# Patient Record
Sex: Female | Born: 1970
Health system: Southern US, Community
[De-identification: ages and names within clinical notes are randomized; demographics above are authoritative.]

## PROBLEM LIST (undated history)

## (undated) DIAGNOSIS — T7840XA Allergy, unspecified, initial encounter: Secondary | ICD-10-CM

## (undated) DIAGNOSIS — F53 Postpartum depression: Secondary | ICD-10-CM

## (undated) DIAGNOSIS — K219 Gastro-esophageal reflux disease without esophagitis: Secondary | ICD-10-CM

## (undated) DIAGNOSIS — O99345 Other mental disorders complicating the puerperium: Secondary | ICD-10-CM

## (undated) DIAGNOSIS — R002 Palpitations: Secondary | ICD-10-CM

## (undated) HISTORY — DX: Postpartum depression: F53.0

## (undated) HISTORY — DX: Palpitations: R00.2

## (undated) HISTORY — DX: Other mental disorders complicating the puerperium: O99.345

## (undated) HISTORY — DX: Allergy, unspecified, initial encounter: T78.40XA

## (undated) HISTORY — PX: COLONOSCOPY: SHX174

---

## 2009-04-10 ENCOUNTER — Ambulatory Visit: Payer: Self-pay | Admitting: Gastroenterology

## 2009-04-10 DIAGNOSIS — K648 Other hemorrhoids: Secondary | ICD-10-CM | POA: Insufficient documentation

## 2009-04-14 ENCOUNTER — Telehealth: Payer: Self-pay | Admitting: Gastroenterology

## 2009-04-30 ENCOUNTER — Ambulatory Visit: Payer: Self-pay | Admitting: Gastroenterology

## 2009-08-23 LAB — HM PAP SMEAR: HM Pap smear: NORMAL

## 2011-04-27 ENCOUNTER — Telehealth: Payer: Self-pay | Admitting: *Deleted

## 2011-04-27 DIAGNOSIS — Z0000 Encounter for general adult medical examination without abnormal findings: Secondary | ICD-10-CM

## 2011-04-27 NOTE — Telephone Encounter (Signed)
Received staff msg pt coming in for CPX. Need labs entered in EPIC....04/27/11@12 :15pm/LMB

## 2011-05-21 ENCOUNTER — Other Ambulatory Visit (INDEPENDENT_AMBULATORY_CARE_PROVIDER_SITE_OTHER): Payer: BC Managed Care – PPO

## 2011-05-21 ENCOUNTER — Ambulatory Visit (INDEPENDENT_AMBULATORY_CARE_PROVIDER_SITE_OTHER): Payer: BC Managed Care – PPO | Admitting: Internal Medicine

## 2011-05-21 ENCOUNTER — Encounter: Payer: Self-pay | Admitting: Internal Medicine

## 2011-05-21 ENCOUNTER — Other Ambulatory Visit: Payer: Self-pay | Admitting: Internal Medicine

## 2011-05-21 ENCOUNTER — Ambulatory Visit: Payer: Self-pay | Admitting: Internal Medicine

## 2011-05-21 VITALS — BP 100/62 | HR 48 | Temp 98.6°F | Ht 65.0 in | Wt 179.4 lb

## 2011-05-21 DIAGNOSIS — Z0000 Encounter for general adult medical examination without abnormal findings: Secondary | ICD-10-CM

## 2011-05-21 LAB — URINALYSIS, ROUTINE W REFLEX MICROSCOPIC
Bilirubin Urine: NEGATIVE
Ketones, ur: NEGATIVE
Leukocytes, UA: NEGATIVE
Urine Glucose: NEGATIVE
Urobilinogen, UA: 0.2 (ref 0.0–1.0)
pH: 7 (ref 5.0–8.0)

## 2011-05-21 LAB — CBC WITH DIFFERENTIAL/PLATELET
Basophils Absolute: 0 10*3/uL (ref 0.0–0.1)
Basophils Relative: 0.5 % (ref 0.0–3.0)
Eosinophils Absolute: 0.1 10*3/uL (ref 0.0–0.7)
HCT: 38.1 % (ref 36.0–46.0)
Hemoglobin: 12.8 g/dL (ref 12.0–15.0)
Lymphs Abs: 1.9 10*3/uL (ref 0.7–4.0)
MCHC: 33.4 g/dL (ref 30.0–36.0)
MCV: 89.5 fl (ref 78.0–100.0)
Neutro Abs: 5.7 10*3/uL (ref 1.4–7.7)
RBC: 4.26 Mil/uL (ref 3.87–5.11)
RDW: 12.9 % (ref 11.5–14.6)

## 2011-05-21 LAB — HEPATIC FUNCTION PANEL
Alkaline Phosphatase: 45 U/L (ref 39–117)
Bilirubin, Direct: 0 mg/dL (ref 0.0–0.3)
Total Bilirubin: 0.5 mg/dL (ref 0.3–1.2)

## 2011-05-21 LAB — LIPID PANEL
HDL: 46.2 mg/dL (ref >39.00)
Total CHOL/HDL Ratio: 4
VLDL: 24 mg/dL (ref 0.0–40.0)

## 2011-05-21 LAB — BASIC METABOLIC PANEL
CO2: 27 mEq/L (ref 19–32)
Calcium: 9.4 mg/dL (ref 8.4–10.5)
GFR: 133.4 mL/min (ref >60.00)
Sodium: 139 mEq/L (ref 135–145)

## 2011-05-21 LAB — TSH: TSH: 0.86 u[IU]/mL (ref 0.35–5.50)

## 2011-05-21 NOTE — Progress Notes (Signed)
  Subjective:    Patient ID: Andrea Wells, female    DOB: 01-03-1971, 40 y.o.   MRN: [id number]  HPI patient is here today for annual physical. Patient feels well and has no complaints.  History reviewed. No pertinent past medical history.  Family History  Problem Relation Age of Onset  . Crohn's disease Mother   . Prostate cancer Father   . Colon cancer Father   . Hypertension Father   . Alcohol abuse Other   . Diabetes Other   . Breast cancer Other     grandparent   History  Substance Use Topics  . Smoking status: Never Smoker   . Smokeless tobacco: Not on file  . Alcohol Use: Yes    Review of Systems Constitutional: Negative for fever.  Respiratory: Negative for cough and shortness of breath.   Cardiovascular: Negative for chest pain.  Gastrointestinal: Negative for abdominal pain.  Musculoskeletal: Negative for gait problem.  Skin: Negative for rash.  Neurological: Negative for dizziness.  No other specific complaints in a complete review of systems (except as listed in HPI above).    Objective:   Physical Exam BP 100/62  Pulse 48  Temp(Src) 98.6 F (37 C) (Oral)  Ht 5\' 5"  (1.651 m)  Wt 179 lb 6.4 oz (81.375 kg)  BMI 29.85 kg/m2  SpO2 98% Constitutional: She is mildly overweight. She appears well-developed and well-nourished. No distress.  HENT: Head: Normocephalic and atraumatic. Ears: B TMs ok, no erythema or effusion; Nose: Nose normal.  Mouth/Throat: Oropharynx is clear and moist. No oropharyngeal exudate.  Eyes: Conjunctivae and EOM are normal. Pupils are equal, round, and reactive to light. No scleral icterus.  Neck: Normal range of motion. Neck supple. No JVD present. No thyromegaly present.  Cardiovascular: Normal rate, regular rhythm and normal heart sounds.  No murmur heard. No BLE edema. Pulmonary/Chest: Effort normal and breath sounds normal. No respiratory distress. She has no wheezes.  Abdominal: Soft. Bowel sounds are normal. She exhibits no  distension. There is no tenderness. no masses Musculoskeletal: Normal range of motion, no joint effusions. No gross deformities GU: defer to gyn Neurological: She is alert and oriented to person, place, and time. No cranial nerve deficit. Coordination normal.  Skin: Skin is warm and dry. No rash noted. No erythema.  Psychiatric: She has a normal mood and affect. Her behavior is normal. Judgment and thought content normal.   No results found for this basename: WBC, HGB, HCT, PLT, CHOL, TRIG, HDL, LDLDIRECT, ALT, AST, NA, K, CL, CREATININE, BUN, CO2, TSH, PSA, INR, GLUF, HGBA1C, MICROALBUR        Assessment & Plan:  CPX - v70.0 - Patient has been counseled on age-appropriate routine health concerns for screening and prevention. These are reviewed and up-to-date. Immunizations are up-to-date or declined. Labsordered and will be reviewed.   FH colon ca - dad age 70, pGF died age 64s - pt screen colo 01/07/2009 unremarkable - send for copy of records - to consider genetic testing if dad tests positive for hereditary problem

## 2011-05-21 NOTE — Patient Instructions (Signed)
It was good to see you today. Exam looks good today - Health Maintenance reviewed - you are up date Test(s) ordered today. Your results will be called to you after review (48-72hours after test completion). If any changes need to be made, you will be notified at that time. Please schedule followup in 1 year for physical and labs, call sooner if problems.

## 2011-05-24 ENCOUNTER — Telehealth: Payer: Self-pay | Admitting: *Deleted

## 2011-05-24 LAB — LDL CHOLESTEROL, DIRECT: Direct LDL: 149.7 mg/dL

## 2011-05-24 NOTE — Telephone Encounter (Signed)
Called pt to ask about her colonoscopy she had done back in 2010. Pt states she had it done with our [place] group but they can't find pt in our system. Ask pt if she had a copy of report or know which md done colonoscopy. Pt state she will get information and give [place] a call back...05/24/11@4 :49pm/LMB

## 2011-05-25 ENCOUNTER — Telehealth: Payer: Self-pay | Admitting: *Deleted

## 2011-05-25 NOTE — Telephone Encounter (Signed)
Fish oil is fine (1000 mg daily) but unlikely to significantly change LDL - if pt has any home copy from her colo, please ask her to bring it to [place] - otherwise, will plan GI re-eval in 2015 regardless due to FH colo ca in dad - thanks

## 2011-05-25 NOTE — Telephone Encounter (Signed)
Called medical records & GI department on seeing if they can locate colonoscopy that pt states she had done with Dr. Arlyce Dice 2010. No records can be find on pt not in our system, check the courier system maybe had test done @ hospital, still they are no records to be find. Also pt call and wanted to know should she take fish oil to help with her cholestrol....05/25/11@12 :05pm/LMB

## 2011-05-25 NOTE — Telephone Encounter (Signed)
Notified pt with md response. Also let pt know not able to locate colonoscopy pt states she is getting records from her previous md, and once she get records will drop copy off...05/25/11@4 :33pm/LMB

## 2011-09-21 ENCOUNTER — Telehealth: Payer: Self-pay | Admitting: Internal Medicine

## 2011-09-21 NOTE — Telephone Encounter (Signed)
Received copies from [facility],on 1.29.13. Forwarded 3 pages to Dr. Felicity Coyer ,for review.  sj

## 2011-12-07 ENCOUNTER — Encounter: Payer: Self-pay | Admitting: Internal Medicine

## 2011-12-07 ENCOUNTER — Ambulatory Visit (INDEPENDENT_AMBULATORY_CARE_PROVIDER_SITE_OTHER): Payer: BC Managed Care – PPO | Admitting: Internal Medicine

## 2011-12-07 ENCOUNTER — Other Ambulatory Visit (INDEPENDENT_AMBULATORY_CARE_PROVIDER_SITE_OTHER): Payer: Self-pay

## 2011-12-07 VITALS — BP 110/70 | HR 61 | Temp 98.8°F | Ht 65.5 in | Wt 170.0 lb

## 2011-12-07 DIAGNOSIS — R0789 Other chest pain: Secondary | ICD-10-CM

## 2011-12-07 LAB — CBC WITH DIFFERENTIAL/PLATELET
Basophils Absolute: 0.1 10*3/uL (ref 0.0–0.1)
Eosinophils Absolute: 0.1 10*3/uL (ref 0.0–0.7)
HCT: 39 % (ref 36.0–46.0)
Lymphs Abs: 1.8 10*3/uL (ref 0.7–4.0)
MCV: 88.7 fl (ref 78.0–100.0)
Monocytes Absolute: 0.4 10*3/uL (ref 0.1–1.0)
Platelets: 262 10*3/uL (ref 150.0–400.0)
RDW: 13.6 % (ref 11.5–14.6)

## 2011-12-07 LAB — HEPATIC FUNCTION PANEL
AST: 23 U/L (ref 0–37)
Albumin: 4.4 g/dL (ref 3.5–5.2)
Bilirubin, Direct: 0 mg/dL (ref 0.0–0.3)

## 2011-12-07 LAB — BASIC METABOLIC PANEL
BUN: 14 mg/dL (ref 6–23)
CO2: 24 mEq/L (ref 19–32)
Calcium: 9.6 mg/dL (ref 8.4–10.5)
Creatinine, Ser: 0.7 mg/dL (ref 0.4–1.2)

## 2011-12-07 MED ORDER — OMEPRAZOLE 40 MG PO CPDR: 40.0000 mg | DELAYED_RELEASE_CAPSULE | Freq: Every day | ORAL | Status: DC

## 2011-12-07 NOTE — Progress Notes (Signed)
  Subjective:    Patient ID: Andrea Wells, female    DOB: [birth year], 41 y.o.   MRN: [id number]  Chest Pain  This is a new problem. The current episode started 1 to 4 weeks ago. The onset quality is sudden. The problem occurs daily. The problem has been gradually worsening. The pain is present in the substernal region. The pain is moderate. The quality of the pain is described as dull. The pain radiates to the left neck, left shoulder and upper back. Associated symptoms include palpitations. Pertinent negatives include no cough, diaphoresis, dizziness, exertional chest pressure, fever, nausea, near-syncope, syncope or vomiting. She has tried NSAIDs and antacids for the symptoms. The treatment provided no relief. Risk factors include stress.  Pertinent negatives for family medical history include: no CAD in family, no Marfan's syndrome in family, no PE in family, no stroke in family and no sudden death in family.     Past Medical History  Diagnosis Date  . No significant past medical history     Review of Systems  Constitutional: Negative for fever and diaphoresis.  Respiratory: Negative for cough.   Cardiovascular: Positive for chest pain and palpitations. Negative for syncope and near-syncope.  Gastrointestinal: Negative for nausea and vomiting.  Neurological: Negative for dizziness.       Objective:   Physical Exam BP 110/70  Pulse 61  Temp(Src) 98.8 F (37.1 C) (Oral)  Ht 5' 5.5" (1.664 m)  Wt 170 lb (77.111 kg)  BMI 27.86 kg/m2  SpO2 98% Wt Readings from Last 3 Encounters:  12/07/11 170 lb (77.111 kg)  05/21/11 179 lb 6.4 oz (81.375 kg)  04/10/09 179 lb (81.194 kg)   Constitutional: She appears well-developed and well-nourished. No distress.  Neck: Normal range of motion. Neck supple. No JVD present. No thyromegaly present.  Cardiovascular: Normal rate, regular rhythm and normal heart sounds.  No murmur heard. No BLE edema. Pulmonary/Chest: Effort normal and breath  sounds normal. No respiratory distress. She has no wheezes.  Abdominal: Soft. Bowel sounds are normal. She exhibits no distension. There is no tenderness. no masses Skin: Skin is warm and dry. No rash noted. No erythema.  Psychiatric: She has a normal mood and affect. Her behavior is normal. Judgment and thought content normal.   Lab Results  Component Value Date   WBC 8.2 05/21/2011   HGB 12.8 05/21/2011   HCT 38.1 05/21/2011   PLT 296.0 05/21/2011   GLUCOSE 88 05/21/2011   CHOL 204* 05/21/2011   TRIG 120.0 05/21/2011   HDL 46.20 05/21/2011   LDLDIRECT 149.7 05/21/2011   ALT 15 05/21/2011   AST 23 05/21/2011   NA 139 05/21/2011   K 4.3 05/21/2011   CL 105 05/21/2011   CREATININE 0.5 05/21/2011   BUN 12 05/21/2011   CO2 27 05/21/2011   TSH 0.86 05/21/2011   EKG: sinus @ 82 bpm - no ischemic changes    Assessment & Plan:   chest pain - atypical - suspect GERD - EKG ok, no apparent risk factors for VTE or CAD Check labs including D dimer -  change H2B otc to PPI x 30d Consider other testing depending on symptoms and labs, pt to call sooner if worse

## 2011-12-07 NOTE — Patient Instructions (Signed)
It was good to see you today. Exam and EKG look good today -  Test(s) ordered today. Your results will be called to you after review (48-72hours after test completion). If any changes need to be made, you will be notified at that time. Start omeprazole daily in place of it again for possible reflux causing symptoms - Your prescription(s) have been submitted to your pharmacy. Please take as directed and contact our office if you believe you are having problem(s) with the medication(s). Please call if symptoms are unimproved in the next 2 weeks, sooner if worse

## 2012-02-22 ENCOUNTER — Encounter: Payer: Self-pay | Admitting: Internal Medicine

## 2012-02-22 LAB — HM MAMMOGRAPHY: HM Mammogram: NEGATIVE

## 2012-03-02 ENCOUNTER — Other Ambulatory Visit: Payer: Self-pay

## 2012-03-02 MED ORDER — OMEPRAZOLE 40 MG PO CPDR: 40.0000 mg | DELAYED_RELEASE_CAPSULE | Freq: Every day | ORAL | Status: DC

## 2012-07-04 ENCOUNTER — Other Ambulatory Visit: Payer: Self-pay | Admitting: Internal Medicine

## 2012-08-27 ENCOUNTER — Encounter (HOSPITAL_COMMUNITY): Payer: Self-pay | Admitting: *Deleted

## 2012-08-27 ENCOUNTER — Emergency Department (HOSPITAL_COMMUNITY)
Admission: EM | Admit: 2012-08-27 | Discharge: 2012-08-27 | Disposition: A | Discharge status: Home / Self Care | Payer: BC Managed Care – PPO | Source: Home / Self Care | Attending: Emergency Medicine | Admitting: Emergency Medicine

## 2012-08-27 DIAGNOSIS — J40 Bronchitis, not specified as acute or chronic: Secondary | ICD-10-CM

## 2012-08-27 HISTORY — DX: Gastro-esophageal reflux disease without esophagitis: K21.9

## 2012-08-27 MED ORDER — ALBUTEROL SULFATE HFA 108 (90 BASE) MCG/ACT IN AERS: 1.0000 | INHALATION_SPRAY | Freq: Four times a day (QID) | RESPIRATORY_TRACT | Status: DC | PRN

## 2012-08-27 MED ORDER — DOXYCYCLINE HYCLATE 100 MG PO CAPS: 100.0000 mg | ORAL_CAPSULE | Freq: Two times a day (BID) | ORAL | Status: AC

## 2012-08-27 MED ORDER — AZITHROMYCIN 250 MG PO TABS: 250.0000 mg | ORAL_TABLET | Freq: Every day | ORAL | Status: DC

## 2012-08-27 NOTE — ED Provider Notes (Signed)
History     CSN: [id number]  Arrival date & time 08/27/12  1108   First MD Initiated Contact with Patient 08/27/12 1117      Chief Complaint  Patient presents with  . Cough    (Consider location/radiation/quality/duration/timing/severity/associated sxs/prior treatment) HPI Comments: Presents urgent care describing the last 2-3 weeks she's been coughing for the last few days no cough bringing up any productive green-looking sputum. She denies any shortness of breath. She does describe however that when she's coughing she's hurting in different areas of her chest. She has been using Delsym and for her upper congestion was more pronounced during the first week she was using a Neti-pot. Her son was diagnosed with pneumonia and is currently taking antibiotics. She was 2 weeks ago in a skiing trip where she feels her cough started. She denies any nausea vomiting or abdominal pain or diarrheas. She doesn't think she has perceive any fevers.  Patient is a 42 y.o. female presenting with cough. The history is provided by the patient.  Cough This is a recurrent problem. The current episode started more than 1 week ago. The problem occurs constantly. The problem has been gradually worsening. The cough is productive of brown sputum. There has been no fever. Associated symptoms include ear congestion, ear pain and sore throat. Pertinent negatives include no chest pain, no rhinorrhea, no shortness of breath and no wheezing. She is not a smoker. Her past medical history is significant for bronchitis. Her past medical history does not include COPD or asthma.    Past Medical History  Diagnosis Date  . Postpartum depression     prev on sertraline for same  . GERD (gastroesophageal reflux disease)     Past Surgical History  Procedure Date  . Cesarean section 02/2008 & 09/2005    Family History  Problem Relation Age of Onset  . Crohn's disease Mother   . Prostate cancer Father 27  . Colon cancer Father  58  . Hypertension Father   . Alcohol abuse Other   . Diabetes Paternal Aunt   . Breast cancer Paternal Grandmother   . Diabetes Paternal Grandmother     History  Substance Use Topics  . Smoking status: Never Smoker   . Smokeless tobacco: Not on file  . Alcohol Use: Yes     Comment: occasional    OB History    Grav Para Term Preterm Abortions TAB SAB Ect Mult Living                  Review of Systems  Constitutional: Positive for activity change and appetite change. Negative for diaphoresis and unexpected weight change.  HENT: Positive for ear pain, congestion, sore throat and sinus pressure. Negative for rhinorrhea, trouble swallowing and voice change.   Respiratory: Positive for cough. Negative for apnea, choking, shortness of breath and wheezing.   Cardiovascular: Negative for chest pain, palpitations and leg swelling.  Neurological: Negative for dizziness.    Allergies  Penicillins and Sulfonamide derivatives  Home Medications   Current Outpatient Rx  Name  Route  Sig  Dispense  Refill  . VITAMIN D 1000 UNITS PO TABS   Oral   Take 1,000 Units by mouth daily.           Marland Kitchen ONE-DAILY MULTI VITAMINS PO TABS   Oral   Take 1 tablet by mouth daily.           Marland Kitchen FISH OIL PO   Oral   Take by  mouth.         . OMEPRAZOLE 40 MG PO CPDR      take 1 capsule by mouth once daily   30 capsule   1     Overdue for yearly follow-up must see md b4 additi ...   . ALBUTEROL SULFATE HFA 108 (90 BASE) MCG/ACT IN AERS   Inhalation   Inhale 1-2 puffs into the lungs every 6 (six) hours as needed for wheezing.   1 Inhaler   0   . AZITHROMYCIN 250 MG PO TABS   Oral   Take 1 tablet (250 mg total) by mouth daily. Take first 2 tablets together, then 1 every day until finished.   6 tablet   0   . DOXYCYCLINE HYCLATE 100 MG PO CAPS   Oral   Take 1 capsule (100 mg total) by mouth 2 (two) times daily.   20 capsule   0     BP 123/84  Pulse 89  Temp 100 F (37.8 C)  (Oral)  Resp 19  SpO2 99%  LMP 08/26/2012  Physical Exam  Nursing note and vitals reviewed. Constitutional: Vital signs are normal. She appears well-developed and well-nourished.  Non-toxic appearance. She does not have a sickly appearance. She does not appear ill. No distress.  HENT:  Head: Normocephalic.  Right Ear: Tympanic membrane normal.  Left Ear: Tympanic membrane normal.  Mouth/Throat: Uvula is midline and mucous membranes are normal. Posterior oropharyngeal erythema present. No oropharyngeal exudate or tonsillar abscesses.  Eyes: Conjunctivae normal are normal. Right eye exhibits no discharge. Left eye exhibits no discharge. No scleral icterus.  Neck: Neck supple. No JVD present.  Pulmonary/Chest: Effort normal and breath sounds normal. No respiratory distress. She has no wheezes. She has no rales. She exhibits tenderness.  Lymphadenopathy:    She has no cervical adenopathy.  Neurological: She is alert.  Skin: No rash noted.    ED Course  Procedures (including critical care time)  Labs Reviewed - No data to display No results found.   1. Bronchitis       MDM  Patient with upper respiratory symptoms that have been isolating and with worsening. Most likely patient is developing bacterial bronchitis we'll treat with both broad-spectrum an atypical coverage. Was also prescribed a bronchodilator for reactive cough. Patient looks well in no respiratory distress afebrile.      Jimmie Molly, MD 08/27/12 1147

## 2012-08-27 NOTE — ED Notes (Signed)
C/O slightly productive "annoying" cough x 3 wks; has gotten worse since yesterday - coughing up green sputum, c/o painful deep breathing, not feeling well.  Has been using Neti Pot and taking Delsym.  Son just diagnosed with pneumonia.  Unsure if fevers.

## 2012-08-27 NOTE — ED Notes (Signed)
Patient denies any HA or vision changes.

## 2012-09-05 ENCOUNTER — Other Ambulatory Visit: Payer: Self-pay | Admitting: Internal Medicine

## 2012-12-12 ENCOUNTER — Telehealth: Payer: Self-pay | Admitting: *Deleted

## 2012-12-12 DIAGNOSIS — Z0000 Encounter for general adult medical examination without abnormal findings: Secondary | ICD-10-CM

## 2012-12-12 NOTE — Telephone Encounter (Signed)
Received staff msg pt made cpx for June. Entering cpx labs...Raechel Chute

## 2012-12-12 NOTE — Telephone Encounter (Signed)
Message copied by Deatra James on Tue Dec 12, 2012  9:51 AM ------      Message from: Livingston Diones      Created: Tue Dec 12, 2012  9:24 AM       Pt has an appt for CPE 01/24/2013. Please put lab work in a week prior.             Thanks      [place] ------

## 2013-01-16 ENCOUNTER — Other Ambulatory Visit (INDEPENDENT_AMBULATORY_CARE_PROVIDER_SITE_OTHER): Payer: BC Managed Care – PPO

## 2013-01-16 DIAGNOSIS — Z0000 Encounter for general adult medical examination without abnormal findings: Secondary | ICD-10-CM

## 2013-01-16 LAB — URINALYSIS, ROUTINE W REFLEX MICROSCOPIC
Bilirubin Urine: NEGATIVE
Nitrite: NEGATIVE
RBC / HPF: NONE SEEN (ref 0–?)
Specific Gravity, Urine: 1.01 (ref 1.000–1.030)
Total Protein, Urine: NEGATIVE
pH: 6.5 (ref 5.0–8.0)

## 2013-01-16 LAB — CBC WITH DIFFERENTIAL/PLATELET
Basophils Relative: 0.7 % (ref 0.0–3.0)
Eosinophils Relative: 2.4 % (ref 0.0–5.0)
HCT: 37.4 % (ref 36.0–46.0)
MCV: 87.2 fl (ref 78.0–100.0)
Monocytes Absolute: 0.4 10*3/uL (ref 0.1–1.0)
Monocytes Relative: 7.3 % (ref 3.0–12.0)
Neutrophils Relative %: 56 % (ref 43.0–77.0)
RBC: 4.29 Mil/uL (ref 3.87–5.11)
WBC: 5.6 10*3/uL (ref 4.5–10.5)

## 2013-01-16 LAB — LDL CHOLESTEROL, DIRECT: Direct LDL: 150.7 mg/dL

## 2013-01-16 LAB — BASIC METABOLIC PANEL
Chloride: 104 mEq/L (ref 96–112)
Creatinine, Ser: 0.6 mg/dL (ref 0.4–1.2)
Potassium: 4.2 mEq/L (ref 3.5–5.1)

## 2013-01-16 LAB — HEPATIC FUNCTION PANEL
Albumin: 3.9 g/dL (ref 3.5–5.2)
Alkaline Phosphatase: 41 U/L (ref 39–117)

## 2013-01-16 LAB — LIPID PANEL
Total CHOL/HDL Ratio: 4
VLDL: 18.8 mg/dL (ref 0.0–40.0)

## 2013-01-24 ENCOUNTER — Ambulatory Visit (INDEPENDENT_AMBULATORY_CARE_PROVIDER_SITE_OTHER): Payer: BC Managed Care – PPO | Admitting: Internal Medicine

## 2013-01-24 ENCOUNTER — Encounter: Payer: Self-pay | Admitting: Internal Medicine

## 2013-01-24 VITALS — BP 102/72 | HR 61 | Temp 98.4°F | Ht 65.5 in | Wt 174.0 lb

## 2013-01-24 DIAGNOSIS — Z0000 Encounter for general adult medical examination without abnormal findings: Secondary | ICD-10-CM

## 2013-01-24 NOTE — Progress Notes (Signed)
Subjective:    Patient ID: Andrea Wells, female    DOB: August 02, 1971, 42 y.o.   MRN: [id number]  HPI  patient is here today for annual physical. Patient feels well and has no complaints.  Past Medical History  Diagnosis Date  . Postpartum depression     prev on sertraline for same  . GERD (gastroesophageal reflux disease)    Family History  Problem Relation Age of Onset  . Crohn's disease Mother   . Prostate cancer Father 78  . Colon cancer Father 96  . Hypertension Father   . Alcohol abuse Other   . Diabetes Paternal Aunt   . Breast cancer Paternal Grandmother   . Diabetes Paternal Grandmother   . Breast cancer Paternal Aunt 68  . Heart Problems Maternal Grandfather 50    complications of valve repair - died intraop  . Heart Problems Maternal Uncle     open heart valve repair, died year after   History  Substance Use Topics  . Smoking status: Never Smoker   . Smokeless tobacco: Not on file  . Alcohol Use: Yes     Comment: occasional    Review of Systems Constitutional: Negative for fever or weight change.  Respiratory: Negative for cough and shortness of breath.   Cardiovascular: Negative for chest pain or palpitations.  Gastrointestinal: Negative for abdominal pain, no bowel changes.  Musculoskeletal: Negative for gait problem or joint swelling.  Skin: Negative for rash.  Neurological: Negative for dizziness or headache.  No other specific complaints in a complete review of systems (except as listed in HPI above).     Objective:   Physical Exam BP 102/72  Pulse 61  Temp(Src) 98.4 F (36.9 C) (Oral)  Ht 5' 5.5" (1.664 m)  Wt 174 lb (78.926 kg)  BMI 28.5 kg/m2  SpO2 99% Wt Readings from Last 3 Encounters:  01/24/13 174 lb (78.926 kg)  12/07/11 170 lb (77.111 kg)  05/21/11 179 lb 6.4 oz (81.375 kg)   Constitutional: She appears well-developed and well-nourished. No distress.  HENT: Head: Normocephalic and atraumatic. Ears: B TMs ok, no erythema or  effusion; Nose: Nose normal. Mouth/Throat: Oropharynx is clear and moist. No oropharyngeal exudate.  Eyes: Conjunctivae and EOM are normal. Pupils are equal, round, and reactive to light. No scleral icterus.  Neck: Normal range of motion. Neck supple. No JVD present. No thyromegaly present.  Cardiovascular: Normal rate, regular rhythm and normal heart sounds.  No murmur heard. No BLE edema. Pulmonary/Chest: Effort normal and breath sounds normal. No respiratory distress. She has no wheezes.  Abdominal: Soft. Bowel sounds are normal. She exhibits no distension. There is no tenderness. no masses Musculoskeletal: Normal range of motion, no joint effusions. No gross deformities Neurological: She is alert and oriented to person, place, and time. No cranial nerve deficit. Coordination, balance, strength, speech and gait are normal.  Skin: Skin is warm and dry. No rash noted. No erythema.  Psychiatric: She has a normal mood and affect. Her behavior is normal. Judgment and thought content normal.   Lab Results  Component Value Date   WBC 5.6 01/16/2013   HGB 12.7 01/16/2013   HCT 37.4 01/16/2013   PLT 251.0 01/16/2013   GLUCOSE 88 01/16/2013   CHOL 213* 01/16/2013   TRIG 94.0 01/16/2013   HDL 49.90 01/16/2013   LDLDIRECT 150.7 01/16/2013   ALT 17 01/16/2013   AST 22 01/16/2013   NA 137 01/16/2013   K 4.2 01/16/2013   CL 104 01/16/2013  CREATININE 0.6 01/16/2013   BUN 12 01/16/2013   CO2 26 01/16/2013   TSH 2.16 01/16/2013        Assessment & Plan:   CPX/v70.0 - Patient has been counseled on age-appropriate routine health concerns for screening and prevention. These are reviewed and up-to-date. Immunizations are up-to-date or declined. Labs reviewed.

## 2013-01-24 NOTE — Patient Instructions (Signed)
It was good to see you today. We have reviewed your prior records including labs and tests today Health Maintenance reviewed - all recommended immunizations and age-appropriate screenings are up-to-date. Elbow: tender to palpation over medial epicondyle. Pain with resistance to wrist flexion and pronation. FROM, no effusion. No swelling, redness or abnormal warmth. Neurovascularly intact and no open wounds. Continue to work on lifestyle changes as discussed (low fat, low carb, increased protein diet; improved exercise efforts; weight loss) to control sugar, blood pressure and cholesterol levels and/or reduce risk of developing other medical problems. Look into [email or web address] or other type of food journal to assist you in this process. Please schedule followup in 12 months for medical physical and labs, call sooner if problems.  Health Maintenance, Females A healthy lifestyle and preventative care can promote health and wellness.  Maintain regular health, dental, and eye exams.  Eat a healthy diet. Foods like vegetables, fruits, whole grains, low-fat dairy products, and lean protein foods contain the nutrients you need without too many calories. Decrease your intake of foods high in solid fats, added sugars, and salt. Get information about a proper diet from your caregiver, if necessary.  Regular physical exercise is one of the most important things you can do for your health. Most adults should get at least 150 minutes of moderate-intensity exercise (any activity that increases your heart rate and causes you to sweat) each week. In addition, most adults need muscle-strengthening exercises on 2 or more days a week.   Maintain a healthy weight. The body mass index (BMI) is a screening tool to identify possible weight problems. It provides an estimate of body fat based on height and weight. Your caregiver can help determine your BMI, and can help you achieve or maintain a healthy weight. For adults  20 years and older:  A BMI below 18.5 is considered underweight.  A BMI of 18.5 to 24.9 is normal.  A BMI of 25 to 29.9 is considered overweight.  A BMI of 30 and above is considered obese.  Maintain normal blood lipids and cholesterol by exercising and minimizing your intake of saturated fat. Eat a balanced diet with plenty of fruits and vegetables. Blood tests for lipids and cholesterol should begin at age 84 and be repeated every 5 years. If your lipid or cholesterol levels are high, you are over 50, or you are a high risk for heart disease, you may need your cholesterol levels checked more frequently.Ongoing high lipid and cholesterol levels should be treated with medicines if diet and exercise are not effective.  If you smoke, find out from your caregiver how to quit. If you do not use tobacco, do not start.  If you are pregnant, do not drink alcohol. If you are breastfeeding, be very cautious about drinking alcohol. If you are not pregnant and choose to drink alcohol, do not exceed 1 drink per day. One drink is considered to be 12 ounces (355 mL) of beer, 5 ounces (148 mL) of wine, or 1.5 ounces (44 mL) of liquor.  Avoid use of street drugs. Do not share needles with anyone. Ask for help if you need support or instructions about stopping the use of drugs.  High blood pressure causes heart disease and increases the risk of stroke. Blood pressure should be checked at least every 1 to 2 years. Ongoing high blood pressure should be treated with medicines, if weight loss and exercise are not effective.  If you are 63 to 41 years old, ask  your caregiver if you should take aspirin to prevent strokes.  Diabetes screening involves taking a blood sample to check your fasting blood sugar level. This should be done once every 3 years, after age 68, if you are within normal weight and without risk factors for diabetes. Testing should be considered at a younger age or be carried out more frequently if  you are overweight and have at least 1 risk factor for diabetes.  Breast cancer screening is essential preventative care for women. You should practice "breast self-awareness." This means understanding the normal appearance and feel of your breasts and may include breast self-examination. Any changes detected, no matter how small, should be reported to a caregiver. Women in their 26s and 30s should have a clinical breast exam (CBE) by a caregiver as part of a regular health exam every 1 to 3 years. After age 70, women should have a CBE every year. Starting at age 20, women should consider having a mammogram (breast X-ray) every year. Women who have a family history of breast cancer should talk to their caregiver about genetic screening. Women at a high risk of breast cancer should talk to their caregiver about having an MRI and a mammogram every year.  The Pap test is a screening test for cervical cancer. Women should have a Pap test starting at age 33. Between ages 71 and 58, Pap tests should be repeated every 2 years. Beginning at age 23, you should have a Pap test every 3 years as long as the past 3 Pap tests have been normal. If you had a hysterectomy for a problem that was not cancer or a condition that could lead to cancer, then you no longer need Pap tests. If you are between ages 23 and 89, and you have had normal Pap tests going back 10 years, you no longer need Pap tests. If you have had past treatment for cervical cancer or a condition that could lead to cancer, you need Pap tests and screening for cancer for at least 20 years after your treatment. If Pap tests have been discontinued, risk factors (such as a new sexual partner) need to be reassessed to determine if screening should be resumed. Some women have medical problems that increase the chance of getting cervical cancer. In these cases, your caregiver may recommend more frequent screening and Pap tests.  The human papillomavirus (HPV) test is  an additional test that may be used for cervical cancer screening. The HPV test looks for the virus that can cause the cell changes on the cervix. The cells collected during the Pap test can be tested for HPV. The HPV test could be used to screen women aged 30 years and older, and should be used in women of any age who have unclear Pap test results. After the age of 41, women should have HPV testing at the same frequency as a Pap test.  Colorectal cancer can be detected and often prevented. Most routine colorectal cancer screening begins at the age of 88 and continues through age 65. However, your caregiver may recommend screening at an earlier age if you have risk factors for colon cancer. On a yearly basis, your caregiver may provide home test kits to check for hidden blood in the stool. Use of a small camera at the end of a tube, to directly examine the colon (sigmoidoscopy or colonoscopy), can detect the earliest forms of colorectal cancer. Talk to your caregiver about this at age 7, when routine screening  begins. Direct examination of the colon should be repeated every 5 to 10 years through age 76, unless early forms of pre-cancerous polyps or small growths are found.  Hepatitis C blood testing is recommended for all people born from [birth year] through 1964 and any individual with known risks for hepatitis C.  Practice safe sex. Use condoms and avoid high-risk sexual practices to reduce the spread of sexually transmitted infections (STIs). Sexually active women aged 72 and younger should be checked for Chlamydia, which is a common sexually transmitted infection. Older women with new or multiple partners should also be tested for Chlamydia. Testing for other STIs is recommended if you are sexually active and at increased risk.  Osteoporosis is a disease in which the bones lose minerals and strength with aging. This can result in serious bone fractures. The risk of osteoporosis can be identified using a bone  density scan. Women ages 41 and over and women at risk for fractures or osteoporosis should discuss screening with their caregivers. Ask your caregiver whether you should be taking a calcium supplement or vitamin D to reduce the rate of osteoporosis.  Menopause can be associated with physical symptoms and risks. Hormone replacement therapy is available to decrease symptoms and risks. You should talk to your caregiver about whether hormone replacement therapy is right for you.  Use sunscreen with a sun protection factor (SPF) of 30 or greater. Apply sunscreen liberally and repeatedly throughout the day. You should seek shade when your shadow is shorter than you. Protect yourself by wearing long sleeves, pants, a wide-brimmed hat, and sunglasses year round, whenever you are outdoors.  Notify your caregiver of new moles or changes in moles, especially if there is a change in shape or color. Also notify your caregiver if a mole is larger than the size of a pencil eraser.  Stay current with your immunizations. Document Released: 02/22/2011 Document Revised: 11/01/2011 Document Reviewed: 02/22/2011 [facility] Patient Information 2014 [place], [place]. Exercise to Lose Weight Exercise and a healthy diet may help you lose weight. Your doctor may suggest specific exercises. EXERCISE IDEAS AND TIPS  Choose low-cost things you enjoy doing, such as walking, bicycling, or exercising to workout videos.  Take stairs instead of the elevator.  Walk during your lunch break.  Park your car further away from work or school.  Go to a gym or an exercise class.  Start with 5 to 10 minutes of exercise each day. Build up to 30 minutes of exercise 4 to 6 days a week.  Wear shoes with good support and comfortable clothes.  Stretch before and after working out.  Work out until you breathe harder and your heart beats faster.  Drink extra water when you exercise.  Do not do so much that you hurt yourself, feel  dizzy, or get very short of breath. Exercises that burn about 150 calories:  Running 1  miles in 15 minutes.  Playing volleyball for 45 to 60 minutes.  Washing and waxing a car for 45 to 60 minutes.  Playing touch football for 45 minutes.  Walking 1  miles in 35 minutes.  Pushing a stroller 1  miles in 30 minutes.  Playing basketball for 30 minutes.  Raking leaves for 30 minutes.  Bicycling 5 miles in 30 minutes.  Walking 2 miles in 30 minutes.  Dancing for 30 minutes.  Shoveling snow for 15 minutes.  Swimming laps for 20 minutes.  Walking up stairs for 15 minutes.  Bicycling 4 miles in 15  minutes.  Gardening for 30 to 45 minutes.  Jumping rope for 15 minutes.  Washing windows or floors for 45 to 60 minutes. Document Released: 09/11/2010 Document Revised: 11/01/2011 Document Reviewed: 09/11/2010 [facility] Patient Information 2014 [place], [place].

## 2013-02-27 ENCOUNTER — Encounter: Payer: Self-pay | Admitting: Internal Medicine

## 2013-04-06 ENCOUNTER — Ambulatory Visit (INDEPENDENT_AMBULATORY_CARE_PROVIDER_SITE_OTHER)
Admission: RE | Admit: 2013-04-06 | Discharge: 2013-04-06 | Disposition: A | Discharge status: Home / Self Care | Payer: BC Managed Care – PPO | Source: Ambulatory Visit | Attending: Internal Medicine | Admitting: Internal Medicine

## 2013-04-06 ENCOUNTER — Encounter: Payer: Self-pay | Admitting: Internal Medicine

## 2013-04-06 ENCOUNTER — Ambulatory Visit (INDEPENDENT_AMBULATORY_CARE_PROVIDER_SITE_OTHER): Payer: BC Managed Care – PPO | Admitting: Internal Medicine

## 2013-04-06 ENCOUNTER — Other Ambulatory Visit (INDEPENDENT_AMBULATORY_CARE_PROVIDER_SITE_OTHER): Payer: BC Managed Care – PPO

## 2013-04-06 VITALS — BP 100/72 | HR 56 | Temp 98.3°F | Wt 173.0 lb

## 2013-04-06 DIAGNOSIS — M546 Pain in thoracic spine: Secondary | ICD-10-CM

## 2013-04-06 DIAGNOSIS — R109 Unspecified abdominal pain: Secondary | ICD-10-CM

## 2013-04-06 LAB — CBC WITH DIFFERENTIAL/PLATELET
Basophils Relative: 0.6 % (ref 0.0–3.0)
Eosinophils Absolute: 0.1 10*3/uL (ref 0.0–0.7)
Eosinophils Relative: 1.8 % (ref 0.0–5.0)
Lymphocytes Relative: 29.6 % (ref 12.0–46.0)
Neutrophils Relative %: 61 % (ref 43.0–77.0)
Platelets: 279 10*3/uL (ref 150.0–400.0)
RBC: 4.32 Mil/uL (ref 3.87–5.11)
WBC: 6.3 10*3/uL (ref 4.5–10.5)

## 2013-04-06 LAB — URINALYSIS, ROUTINE W REFLEX MICROSCOPIC
Bilirubin Urine: NEGATIVE
Leukocytes, UA: NEGATIVE
Nitrite: NEGATIVE
Specific Gravity, Urine: >=1.03 (ref 1.000–1.030)
Total Protein, Urine: NEGATIVE
pH: 6 (ref 5.0–8.0)

## 2013-04-06 LAB — BASIC METABOLIC PANEL
BUN: 11 mg/dL (ref 6–23)
Chloride: 105 mEq/L (ref 96–112)
Glucose, Bld: 79 mg/dL (ref 70–99)
Potassium: 4.3 mEq/L (ref 3.5–5.1)
Sodium: 137 mEq/L (ref 135–145)

## 2013-04-06 LAB — HEPATIC FUNCTION PANEL
ALT: 24 U/L (ref 0–35)
Albumin: 3.8 g/dL (ref 3.5–5.2)
Bilirubin, Direct: 0.1 mg/dL (ref 0.0–0.3)
Total Protein: 7.3 g/dL (ref 6.0–8.3)

## 2013-04-06 MED ORDER — MELOXICAM 15 MG PO TABS: 15.0000 mg | ORAL_TABLET | Freq: Every day | ORAL | Status: DC

## 2013-04-06 NOTE — Patient Instructions (Signed)
It was good to see you today. We have reviewed your prior records including labs and tests today Test(s) ordered today. Your results will be released to MyChart (or called to you) after review, usually within 72hours after test completion. If any changes need to be made, you will be notified at that same time. Medications reviewed and updated, start meloxicam once daily for 2 weeks, then as needed for discomfort - no other changes recommended at this time. Additional followup and/or testing will depend on these results and your response to treatment, call if additional problems or if symptoms worse

## 2013-04-06 NOTE — Progress Notes (Signed)
  Subjective:    Patient ID: Andrea Wells, female    DOB: [birth year], 42 y.o.   MRN: [id number]  HPI Comments: Different than prior IBS pain flares  Flank Pain This is a chronic problem. The current episode started more than 1 month ago. The problem occurs daily. The problem has been waxing and waning since onset. The pain is present in the thoracic spine. The quality of the pain is described as aching. The pain does not radiate. The pain is mild. The pain is the same all the time. Stiffness is present all day. Pertinent negatives include no abdominal pain, chest pain, dysuria, fever, leg pain, numbness, pelvic pain, tingling, weakness or weight loss. Risk factors include obesity (no trauma). She has tried bed rest for the symptoms. The treatment provided no relief.     Past Medical History  Diagnosis Date  . Postpartum depression     prev on sertraline for same  . GERD (gastroesophageal reflux disease)     Review of Systems  Constitutional: Negative for fever and weight loss.  Cardiovascular: Negative for chest pain.  Gastrointestinal: Negative for abdominal pain.  Genitourinary: Positive for flank pain. Negative for dysuria and pelvic pain.  Neurological: Negative for tingling, weakness and numbness.      Objective:   Physical Exam  BP 100/72  Pulse 56  Temp(Src) 98.3 F (36.8 C) (Oral)  Wt 173 lb (78.472 kg)  BMI 28.34 kg/m2  SpO2 99% Wt Readings from Last 3 Encounters:  04/06/13 173 lb (78.472 kg)  01/24/13 174 lb (78.926 kg)  12/07/11 170 lb (77.111 kg)   Constitutional: She appears well-developed and well-nourished. No distress. Neck: Normal range of motion. Neck supple. No JVD present. No thyromegaly present.  Cardiovascular: Normal rate, regular rhythm and normal heart sounds.  No murmur heard. No BLE edema. Pulmonary/Chest: Effort normal and breath sounds normal. No respiratory distress. She has no wheezes.  Abdominal: Soft. Bowel sounds are normal. She exhibits  no distension. There is no tenderness. no masses Skin: Skin is warm and dry. No rash noted. No erythema.  Psychiatric: She has a normal mood and affect. Her behavior is normal. Judgment and thought content normal.   Lab Results  Component Value Date   WBC 5.6 01/16/2013   HGB 12.7 01/16/2013   HCT 37.4 01/16/2013   PLT 251.0 01/16/2013   GLUCOSE 88 01/16/2013   CHOL 213* 01/16/2013   TRIG 94.0 01/16/2013   HDL 49.90 01/16/2013   LDLDIRECT 150.7 01/16/2013   ALT 17 01/16/2013   AST 22 01/16/2013   NA 137 01/16/2013   K 4.2 01/16/2013   CL 104 01/16/2013   CREATININE 0.6 01/16/2013   BUN 12 01/16/2013   CO2 26 01/16/2013   TSH 2.16 01/16/2013        Assessment & Plan:   L flank pain/ thoracic pain - daily symptoms for >6 weeks Exam benign, no associated GI or pulm symptoms, not reproducible on exam 12/2012 labs benign, but new symptoms since that time Recheck labs and refer for non contrast CT to eval for kidney stone issue 2 weeks meloxicam rx'd pending results

## 2013-06-04 ENCOUNTER — Ambulatory Visit (INDEPENDENT_AMBULATORY_CARE_PROVIDER_SITE_OTHER): Payer: BC Managed Care – PPO | Admitting: Internal Medicine

## 2013-06-04 ENCOUNTER — Ambulatory Visit: Payer: BC Managed Care – PPO | Admitting: Internal Medicine

## 2013-06-04 ENCOUNTER — Encounter: Payer: Self-pay | Admitting: Internal Medicine

## 2013-06-04 VITALS — BP 110/70 | HR 60 | Temp 98.6°F | Wt 172.1 lb

## 2013-06-04 DIAGNOSIS — F53 Postpartum depression: Secondary | ICD-10-CM | POA: Insufficient documentation

## 2013-06-04 DIAGNOSIS — R42 Dizziness and giddiness: Secondary | ICD-10-CM

## 2013-06-04 DIAGNOSIS — I4949 Other premature depolarization: Secondary | ICD-10-CM

## 2013-06-04 DIAGNOSIS — F411 Generalized anxiety disorder: Secondary | ICD-10-CM

## 2013-06-04 DIAGNOSIS — I493 Ventricular premature depolarization: Secondary | ICD-10-CM

## 2013-06-04 DIAGNOSIS — F410 Panic disorder [episodic paroxysmal anxiety] without agoraphobia: Secondary | ICD-10-CM

## 2013-06-04 DIAGNOSIS — F419 Anxiety disorder, unspecified: Secondary | ICD-10-CM | POA: Insufficient documentation

## 2013-06-04 MED ORDER — ALPRAZOLAM 0.5 MG PO TABS: 0.5000 mg | ORAL_TABLET | Freq: Two times a day (BID) | ORAL | Status: DC | PRN

## 2013-06-04 NOTE — Progress Notes (Signed)
Subjective:    Patient ID: Andrea Wells, female    DOB: 11-Dec-1970, 42 y.o.   MRN: [id number]  Anxiety Symptoms include decreased concentration, dizziness, excessive worry, irritability, palpitations, panic, restlessness and shortness of breath. Patient reports no compulsions, confusion, depressed mood, muscle tension, nausea or suicidal ideas. Symptoms occur occasionally. The severity of symptoms is causing significant distress. Exacerbated by: stress: youngest son starting k-school, plane travel, spouse out of town travel and uncertain employment prospect. The quality of sleep is non-restorative. Nighttime awakenings: several.   Her past medical history is significant for anxiety/panic attacks and depression. There is no history of anemia, arrhythmia, asthma, bipolar disorder, CAD, CHF, chronic lung disease, fibromyalgia or suicide attempts. Past treatments include SSRIs and counseling (CBT). Compliance with prior treatments has been good (in past).    Past Medical History  Diagnosis Date  . Postpartum depression     prev on sertraline for same  . GERD (gastroesophageal reflux disease)     Review of Systems  Constitutional: Positive for irritability. Negative for fever, fatigue and unexpected weight change.  Respiratory: Positive for shortness of breath.   Cardiovascular: Positive for palpitations.  Gastrointestinal: Negative for nausea.  Neurological: Positive for dizziness and light-headedness. Negative for tremors, syncope, speech difficulty, weakness and numbness.  Psychiatric/Behavioral: Positive for decreased concentration. Negative for suicidal ideas and confusion.       Objective:   Physical Exam BP 110/70  Pulse 60  Temp(Src) 98.6 F (37 C) (Oral)  Wt 172 lb 1.9 oz (78.073 kg)  BMI 28.2 kg/m2  SpO2 98% Wt Readings from Last 3 Encounters:  06/04/13 172 lb 1.9 oz (78.073 kg)  04/06/13 173 lb (78.472 kg)  01/24/13 174 lb (78.926 kg)   Constitutional: She appears  well-developed and well-nourished. No distress. Eyes: Conjunctivae and EOM are normal. Pupils are equal, round, and reactive to light. No scleral icterus.  Neck: Normal range of motion. Neck supple. No JVD present. No thyromegaly present.  Cardiovascular: Normal rate, regular rhythm and normal heart sounds. Notable PVCs; No murmur heard. No BLE edema. Pulmonary/Chest: Effort normal and breath sounds normal. No respiratory distress. She has no wheezes. Neurological: She is alert and oriented to person, place, and time. No cranial nerve deficit. Coordination, balance, strength, speech and gait are normal.  Skin: Skin is warm and dry. No rash noted. No erythema.  Psychiatric: She has a normal mood and affect. Her behavior is normal. Judgment and thought content normal.   Lab Results  Component Value Date   WBC 6.3 04/06/2013   HGB 12.9 04/06/2013   HCT 37.5 04/06/2013   PLT 279.0 04/06/2013   GLUCOSE 79 04/06/2013   CHOL 213* 01/16/2013   TRIG 94.0 01/16/2013   HDL 49.90 01/16/2013   LDLDIRECT 150.7 01/16/2013   ALT 24 04/06/2013   AST 27 04/06/2013   NA 137 04/06/2013   K 4.3 04/06/2013   CL 105 04/06/2013   CREATININE 0.6 04/06/2013   BUN 11 04/06/2013   CO2 26 04/06/2013   TSH 2.16 01/16/2013       Assessment & Plan:   Lightheadedness, episodic PVCs on exam - Panic attack symptoms with anxiety -history of same  Recent labs reviewed and unremarkable ECG today: sinus rhythm at 62 beats per minute, no arrhythmia Refer for 2-D echocardiogram to evaluate cardiac structural anatomy and rule out abnormality  Begin low-dose Xanax as needed for panic attack symptoms and refer to counseling for behavioral support  Followup in 6 weeks for review of tests  and symptoms  Discussion of potential resuming daily SSRI as previously on sertraline, but wishes to avoid daily medication at this time if possible  we reviewed potential risk/benefit of alprazolam use and possible side effects - pt understands  and agrees to same

## 2013-06-04 NOTE — Patient Instructions (Addendum)
It was good to see you today.  We have reviewed your prior records including labs and tests today  ECG today unremarkable for cardiac problem or arrhythmia  Will refer for 2-D echocardiogram which is cardiac ultrasound to evaluate heart anatomy. My office will call regarding this appointment. You will then be contacted about results after review.  In the meanwhile, begin Xanax low dose as needed for panic attack and anxiety symptoms - Your prescription(s) have been submitted to your pharmacy. Please take as directed and contact our office if you believe you are having problem(s) with the medication(s).  we'll make referral to [facility] for counseling [phone]). Our office will contact you regarding appointment(s) once made.  Please schedule followup in 6 weeks for symptoms and test review, call sooner if problems.   Anxiety and Panic Attacks Your caregiver has informed you that you are having an anxiety or panic attack. There may be many forms of this. Most of the time these attacks come suddenly and without warning. They come at any time of day, including periods of sleep, and at any time of life. They may be strong and unexplained. Although panic attacks are very scary, they are physically harmless. Sometimes the cause of your anxiety is not known. Anxiety is a protective mechanism of the body in its fight or flight mechanism. Most of these perceived danger situations are actually nonphysical situations (such as anxiety over losing a job). CAUSES  The causes of an anxiety or panic attack are many. Panic attacks may occur in otherwise healthy people given a certain set of circumstances. There may be a genetic cause for panic attacks. Some medications may also have anxiety as a side effect. SYMPTOMS  Some of the most common feelings are:  Intense terror.  Dizziness, feeling faint.  Hot and cold flashes.  Fear of going crazy.  Feelings that nothing is  real.  Sweating.  Shaking.  Chest pain or a fast heartbeat (palpitations).  Smothering, choking sensations.  Feelings of impending doom and that death is near.  Tingling of extremities, this may be from over-breathing.  Altered reality (derealization).  Being detached from yourself (depersonalization). Several symptoms can be present to make up anxiety or panic attacks. DIAGNOSIS  The evaluation by your caregiver will depend on the type of symptoms you are experiencing. The diagnosis of anxiety or panic attack is made when no physical illness can be determined to be a cause of the symptoms. TREATMENT  Treatment to prevent anxiety and panic attacks may include:  Avoidance of circumstances that cause anxiety.  Reassurance and relaxation.  Regular exercise.  Relaxation therapies, such as yoga.  Psychotherapy with a psychiatrist or therapist.  Avoidance of caffeine, alcohol and illegal drugs.  Prescribed medication. SEEK IMMEDIATE MEDICAL CARE IF:   You experience panic attack symptoms that are different than your usual symptoms.  You have any worsening or concerning symptoms. Document Released: 08/09/2005 Document Revised: 11/01/2011 Document Reviewed: 12/11/2009 [facility] Patient Information 2014 [place], [place].

## 2013-06-25 ENCOUNTER — Ambulatory Visit (HOSPITAL_COMMUNITY): Payer: BC Managed Care – PPO | Attending: Internal Medicine | Admitting: Radiology

## 2013-06-25 ENCOUNTER — Other Ambulatory Visit (HOSPITAL_COMMUNITY): Payer: Self-pay | Admitting: Internal Medicine

## 2013-06-25 DIAGNOSIS — R42 Dizziness and giddiness: Secondary | ICD-10-CM

## 2013-06-25 DIAGNOSIS — I493 Ventricular premature depolarization: Secondary | ICD-10-CM

## 2013-06-25 DIAGNOSIS — I4949 Other premature depolarization: Secondary | ICD-10-CM

## 2013-06-25 NOTE — Progress Notes (Signed)
Echocardiogram performed.  

## 2013-06-27 ENCOUNTER — Encounter: Payer: Self-pay | Admitting: Internal Medicine

## 2013-06-28 ENCOUNTER — Other Ambulatory Visit: Payer: Self-pay

## 2013-09-03 ENCOUNTER — Ambulatory Visit (INDEPENDENT_AMBULATORY_CARE_PROVIDER_SITE_OTHER): Payer: BC Managed Care – PPO | Admitting: Internal Medicine

## 2013-09-03 ENCOUNTER — Encounter: Payer: Self-pay | Admitting: Internal Medicine

## 2013-09-03 VITALS — BP 118/82 | HR 72 | Temp 98.7°F | Resp 16 | Ht 66.0 in | Wt 171.0 lb

## 2013-09-03 DIAGNOSIS — J209 Acute bronchitis, unspecified: Secondary | ICD-10-CM

## 2013-09-03 MED ORDER — AZITHROMYCIN 500 MG PO TABS: 500.0000 mg | ORAL_TABLET | Freq: Every day | ORAL | Status: DC

## 2013-09-03 MED ORDER — HYDROCODONE-HOMATROPINE 5-1.5 MG/5ML PO SYRP: 5.0000 mL | ORAL_SOLUTION | Freq: Three times a day (TID) | ORAL | Status: DC | PRN

## 2013-09-03 NOTE — Progress Notes (Signed)
Pre visit review using our clinic review tool, if applicable. No additional management support is needed unless otherwise documented below in the visit note. 

## 2013-09-03 NOTE — Assessment & Plan Note (Signed)
I will treat the infection with a Zpak and will control the cough with a suppressant

## 2013-09-03 NOTE — Progress Notes (Signed)
   Subjective:    Patient ID: Andrea Wells, female    DOB: March 31, 1971, 43 y.o.   MRN: [id number]  Cough This is a new problem. Episode onset: for 5 days. The problem has been unchanged. The problem occurs every few hours. The cough is productive of purulent sputum. Associated symptoms include ear pain and a sore throat. Pertinent negatives include no chest pain, chills, ear congestion, fever, headaches, heartburn, hemoptysis, myalgias, nasal congestion, postnasal drip, rash, rhinorrhea, shortness of breath, sweats, weight loss or wheezing. She has tried OTC cough suppressant for the symptoms. The treatment provided no relief.      Review of Systems  Constitutional: Positive for fatigue. Negative for fever, chills, weight loss, diaphoresis, activity change, appetite change and unexpected weight change.  HENT: Positive for ear pain and sore throat. Negative for congestion, postnasal drip, rhinorrhea, sinus pressure, tinnitus, trouble swallowing and voice change.   Eyes: Negative.   Respiratory: Positive for cough. Negative for apnea, hemoptysis, choking, chest tightness, shortness of breath and wheezing.   Cardiovascular: Negative.  Negative for chest pain, palpitations and leg swelling.  Gastrointestinal: Negative.  Negative for heartburn and abdominal pain.  Endocrine: Negative.   Genitourinary: Negative.   Musculoskeletal: Negative.  Negative for arthralgias, myalgias and neck stiffness.  Skin: Negative.  Negative for rash.  Allergic/Immunologic: Negative.   Neurological: Negative.  Negative for dizziness, weakness and headaches.  Hematological: Negative.  Negative for adenopathy. Does not bruise/bleed easily.  Psychiatric/Behavioral: Negative.        Objective:   Physical Exam  Vitals reviewed. Constitutional: She is oriented to person, place, and time. She appears well-developed and well-nourished.  Non-toxic appearance. She does not have a sickly appearance. She does not appear  ill. No distress.  HENT:  Head: Normocephalic and atraumatic.  Right Ear: Hearing, tympanic membrane, external ear and ear canal normal.  Left Ear: Hearing, tympanic membrane, external ear and ear canal normal.  Mouth/Throat: Mucous membranes are normal. Mucous membranes are not pale, not dry and not cyanotic. No oral lesions. No trismus in the jaw. No uvula swelling. Posterior oropharyngeal erythema present. No oropharyngeal exudate, posterior oropharyngeal edema or tonsillar abscesses.  Eyes: Conjunctivae are normal. Right eye exhibits no discharge. Left eye exhibits no discharge. No scleral icterus.  Neck: Normal range of motion. Neck supple. No JVD present. No tracheal deviation present. No thyromegaly present.  Cardiovascular: Normal rate, regular rhythm, normal heart sounds and intact distal pulses.  Exam reveals no gallop and no friction rub.   No murmur heard. Pulmonary/Chest: Effort normal and breath sounds normal. No stridor. No respiratory distress. She has no wheezes. She has no rales. She exhibits no tenderness.  Abdominal: Soft. Bowel sounds are normal. She exhibits no distension and no mass. There is no tenderness. There is no rebound and no guarding.  Musculoskeletal: Normal range of motion. She exhibits no edema and no tenderness.  Lymphadenopathy:    She has no cervical adenopathy.  Neurological: She is oriented to person, place, and time.  Skin: Skin is warm and dry. No rash noted. She is not diaphoretic. No erythema. No pallor.          Assessment & Plan:

## 2013-09-03 NOTE — Patient Instructions (Signed)
Acute Bronchitis Bronchitis is inflammation of the airways that extend from the windpipe into the lungs (bronchi). The inflammation often causes mucus to develop. This leads to a cough, which is the most common symptom of bronchitis.  In acute bronchitis, the condition usually develops suddenly and goes away over time, usually in a couple weeks. Smoking, allergies, and asthma can make bronchitis worse. Repeated episodes of bronchitis may cause further lung problems.  CAUSES Acute bronchitis is most often caused by the same virus that causes a cold. The virus can spread from person to person (contagious).  SIGNS AND SYMPTOMS   Cough.   Fever.   Coughing up mucus.   Body aches.   Chest congestion.   Chills.   Shortness of breath.   Sore throat.  DIAGNOSIS  Acute bronchitis is usually diagnosed through a physical exam. Tests, such as chest X-rays, are sometimes done to rule out other conditions.  TREATMENT  Acute bronchitis usually goes away in a couple weeks. Often times, no medical treatment is necessary. Medicines are sometimes given for relief of fever or cough. Antibiotics are usually not needed but may be prescribed in certain situations. In some cases, an inhaler may be recommended to help reduce shortness of breath and control the cough. A cool mist vaporizer may also be used to help thin bronchial secretions and make it easier to clear the chest.  HOME CARE INSTRUCTIONS  Get plenty of rest.   Drink enough fluids to keep your urine clear or pale yellow (unless you have a medical condition that requires fluid restriction). Increasing fluids may help thin your secretions and will prevent dehydration.   Only take over-the-counter or prescription medicines as directed by your health care provider.   Avoid smoking and secondhand smoke. Exposure to cigarette smoke or irritating chemicals will make bronchitis worse. If you are a smoker, consider using nicotine gum or skin  patches to help control withdrawal symptoms. Quitting smoking will help your lungs heal faster.   Reduce the chances of another bout of acute bronchitis by washing your hands frequently, avoiding people with cold symptoms, and trying not to touch your hands to your mouth, nose, or eyes.   Follow up with your health care provider as directed.  SEEK MEDICAL CARE IF: Your symptoms do not improve after 1 week of treatment.  SEEK IMMEDIATE MEDICAL CARE IF:  You develop an increased fever or chills.   You have chest pain.   You have severe shortness of breath.  You have bloody sputum.   You develop dehydration.  You develop fainting.  You develop repeated vomiting.  You develop a severe headache. MAKE SURE YOU:   Understand these instructions.  Will watch your condition.  Will get help right away if you are not doing well or get worse. Document Released: 09/16/2004 Document Revised: 04/11/2013 Document Reviewed: 01/30/2013 [employer] Patient Information 2014 [employer].  

## 2013-12-05 ENCOUNTER — Ambulatory Visit (INDEPENDENT_AMBULATORY_CARE_PROVIDER_SITE_OTHER): Payer: BC Managed Care – PPO | Admitting: Internal Medicine

## 2013-12-05 ENCOUNTER — Other Ambulatory Visit (INDEPENDENT_AMBULATORY_CARE_PROVIDER_SITE_OTHER): Payer: BC Managed Care – PPO

## 2013-12-05 ENCOUNTER — Encounter: Payer: Self-pay | Admitting: Internal Medicine

## 2013-12-05 VITALS — BP 110/72 | HR 64 | Temp 98.8°F | Wt 172.4 lb

## 2013-12-05 DIAGNOSIS — R1012 Left upper quadrant pain: Secondary | ICD-10-CM

## 2013-12-05 DIAGNOSIS — F411 Generalized anxiety disorder: Secondary | ICD-10-CM

## 2013-12-05 LAB — TSH: TSH: 1.42 u[IU]/mL (ref 0.35–5.50)

## 2013-12-05 LAB — BASIC METABOLIC PANEL
BUN: 11 mg/dL (ref 6–23)
CALCIUM: 9.1 mg/dL (ref 8.4–10.5)
CO2: 26 mEq/L (ref 19–32)
Chloride: 107 mEq/L (ref 96–112)
Creatinine, Ser: 0.6 mg/dL (ref 0.4–1.2)
GFR: 128.95 mL/min (ref >60.00)
Glucose, Bld: 88 mg/dL (ref 70–99)
Potassium: 4.1 mEq/L (ref 3.5–5.1)
SODIUM: 137 meq/L (ref 135–145)

## 2013-12-05 LAB — URINALYSIS, ROUTINE W REFLEX MICROSCOPIC
Bilirubin Urine: NEGATIVE
Hgb urine dipstick: NEGATIVE
Ketones, ur: NEGATIVE
LEUKOCYTES UA: NEGATIVE
Nitrite: NEGATIVE
PH: 7 (ref 5.0–8.0)
RBC / HPF: NONE SEEN (ref 0–?)
SPECIFIC GRAVITY, URINE: 1.01 (ref 1.000–1.030)
Total Protein, Urine: NEGATIVE
URINE GLUCOSE: NEGATIVE
Urobilinogen, UA: 0.2 (ref 0.0–1.0)
WBC, UA: NONE SEEN (ref 0–?)

## 2013-12-05 LAB — CBC WITH DIFFERENTIAL/PLATELET
BASOS ABS: 0 10*3/uL (ref 0.0–0.1)
Basophils Relative: 0.7 % (ref 0.0–3.0)
Eosinophils Absolute: 0.1 10*3/uL (ref 0.0–0.7)
Eosinophils Relative: 1.3 % (ref 0.0–5.0)
HCT: 32.9 % — ABNORMAL LOW (ref 36.0–46.0)
Hemoglobin: 11.1 g/dL — ABNORMAL LOW (ref 12.0–15.0)
Lymphocytes Relative: 23.3 % (ref 12.0–46.0)
Lymphs Abs: 1.6 10*3/uL (ref 0.7–4.0)
MCHC: 33.9 g/dL (ref 30.0–36.0)
MCV: 83.3 fl (ref 78.0–100.0)
MONOS PCT: 5.8 % (ref 3.0–12.0)
Monocytes Absolute: 0.4 10*3/uL (ref 0.1–1.0)
Neutro Abs: 4.6 10*3/uL (ref 1.4–7.7)
Neutrophils Relative %: 68.9 % (ref 43.0–77.0)
PLATELETS: 295 10*3/uL (ref 150.0–400.0)
RBC: 3.95 Mil/uL (ref 3.87–5.11)
RDW: 13.1 % (ref 11.5–14.6)
WBC: 6.7 10*3/uL (ref 4.5–10.5)

## 2013-12-05 LAB — HEPATIC FUNCTION PANEL
ALK PHOS: 47 U/L (ref 39–117)
ALT: 12 U/L (ref 0–35)
AST: 17 U/L (ref 0–37)
Albumin: 3.7 g/dL (ref 3.5–5.2)
BILIRUBIN TOTAL: 0.6 mg/dL (ref 0.3–1.2)
Bilirubin, Direct: 0 mg/dL (ref 0.0–0.3)
Total Protein: 7.1 g/dL (ref 6.0–8.3)

## 2013-12-05 LAB — LIPASE: LIPASE: 30 U/L (ref 11.0–59.0)

## 2013-12-05 NOTE — Progress Notes (Signed)
Pre visit review using our clinic review tool, if applicable. No additional management support is needed unless otherwise documented below in the visit note. 

## 2013-12-05 NOTE — Progress Notes (Signed)
Subjective:    Patient ID: Andrea Wells, female    DOB: 1971/04/07, 43 y.o.   MRN: [id number]  Abdominal Pain Associated symptoms include constipation and diarrhea. Pertinent negatives include no fever, frequency or nausea.    Patient is here for follow up -continued abdominal pain  Located L flank, ongoing >1 year Exacerbated by any food/meals - but not particular food type associated with bloat, intermittent diarrhea alt with constipation Not improved with gas x, PPI Prior colo 2010 due to Foley colo ca in dad age 103 - hemorrhoids, otherwise negative   Past Medical History  Diagnosis Date  . Postpartum depression     prev on sertraline for same  . GERD (gastroesophageal reflux disease)     Review of Systems  Constitutional: Negative for fever and fatigue.  Respiratory: Negative for cough and shortness of breath.   Gastrointestinal: Positive for abdominal pain, diarrhea and constipation. Negative for nausea, blood in stool and abdominal distention.  Genitourinary: Positive for flank pain. Negative for frequency.  Hematological: Negative for adenopathy.       Objective:   Physical Exam  BP 110/72  Pulse 64  Temp(Src) 98.8 F (37.1 C) (Oral)  Wt 172 lb 6.4 oz (78.2 kg)  SpO2 99% Wt Readings from Last 3 Encounters:  12/05/13 172 lb 6.4 oz (78.2 kg)  09/03/13 171 lb (77.565 kg)  06/04/13 172 lb 1.9 oz (78.073 kg)   Constitutional: She appears well-developed and well-nourished. No distress.  Neck: Normal range of motion. Neck supple. No JVD present. No thyromegaly present.  Cardiovascular: Normal rate, regular rhythm and normal heart sounds.  No murmur heard. No BLE edema. Pulmonary/Chest: Effort normal and breath sounds normal. No respiratory distress. She has no wheezes.  Abdomen: SNTND+BS -no mass, no r/g -no flank tenderness Psychiatric: She has a normal mood and affect. Her behavior is normal. Judgment and thought content normal.   Lab Results  Component Value  Date   WBC 6.3 04/06/2013   HGB 12.9 04/06/2013   HCT 37.5 04/06/2013   PLT 279.0 04/06/2013   GLUCOSE 79 04/06/2013   CHOL 213* 01/16/2013   TRIG 94.0 01/16/2013   HDL 49.90 01/16/2013   LDLDIRECT 150.7 01/16/2013   ALT 24 04/06/2013   AST 27 04/06/2013   NA 137 04/06/2013   K 4.3 04/06/2013   CL 105 04/06/2013   CREATININE 0.6 04/06/2013   BUN 11 04/06/2013   CO2 26 04/06/2013   TSH 2.16 01/16/2013    Ct Abdomen Pelvis Wo Contrast  04/06/2013   *RADIOLOGY REPORT*  Clinical Data: Left flank pain x6 weeks  CT ABDOMEN AND PELVIS WITHOUT CONTRAST  Technique:  Multidetector CT imaging of the abdomen and pelvis was performed following the standard protocol without intravenous contrast.  Comparison: None.  Findings: Lung bases are clear.  Unenhanced liver, spleen, pancreas, and adrenal glands are within normal limits.  Gallbladder is notable for possible layering sludge.  No intrahepatic or extrahepatic ductal dilatation.  Kidneys are within normal limits.  No renal calculi or hydronephrosis.  No evidence of bowel obstruction.  Normal appendix.  No evidence of abdominal aortic aneurysm.  No suspicious abdominopelvic lymphadenopathy.  Uterus and bilateral ovaries are unremarkable.  Trace pelvic ascites, likely physiologic.  No ureteral or bladder calculi.  Bladder is unremarkable.  Mild degenerative changes of the visualized thoracolumbar spine.  IMPRESSION: No renal, ureteral, or bladder calculi.  No hydronephrosis.  No CT findings to account for the patient's left flank pain.   Original  Report Authenticated By: Julian Hy, M.D.   04/30/09 colo - hemorrhoids -      Assessment & Plan:   Problem List Items Addressed This Visit   Anxiety state, unspecified     Significant component of same on exam Uses alprazolam as needed for same Not currently on SSRI therapy Reassurance provided    LUQ abdominal pain - Primary     Recurrent, chronic symptoms, increase intensity over past 4 weeks No concerning  signs on history or exam Recheck labs today including CBC, LFTs, BUN, urinalysis, lipase Refer back to GI for further evaluation, consider repeat colonoscopy as family history colon cancer reviewed, to provide your followup September 2050 No medication changes recommended at this time If labs and imaging remain unremarkable, and consider further treatment for potential IBS - discussed same with pt today    Relevant Orders      Basic metabolic panel      CBC with Differential      Hepatic function panel      TSH      Urinalysis, Routine w reflex microscopic      Lipase      Ambulatory referral to Gastroenterology

## 2013-12-05 NOTE — Assessment & Plan Note (Signed)
Recurrent, chronic symptoms, increase intensity over past 4 weeks No concerning signs on history or exam Recheck labs today including CBC, LFTs, BUN, urinalysis, lipase Refer back to GI for further evaluation, consider repeat colonoscopy as family history colon cancer reviewed, to provide your followup September 2050 No medication changes recommended at this time If labs and imaging remain unremarkable, and consider further treatment for potential IBS - discussed same with pt today

## 2013-12-05 NOTE — Assessment & Plan Note (Signed)
Significant component of same on exam Uses alprazolam as needed for same Not currently on SSRI therapy Reassurance provided

## 2013-12-05 NOTE — Patient Instructions (Signed)
It was good to see you today.  We have reviewed your prior records including labs and tests today  Test(s) ordered today. Your results will be released to [facility] (or called to you) after review, usually within 72hours after test completion. If any changes need to be made, you will be notified at that same time.  Medications reviewed and updated, no changes recommended at this time.  we'll make referral to gastroenterology. Our office will contact you regarding appointment(s) once made.  Please schedule followup in 6 months, call sooner if problems.

## 2013-12-06 ENCOUNTER — Encounter: Payer: Self-pay | Admitting: Gastroenterology

## 2014-01-25 ENCOUNTER — Encounter: Payer: Self-pay | Admitting: Gastroenterology

## 2014-01-25 ENCOUNTER — Ambulatory Visit (INDEPENDENT_AMBULATORY_CARE_PROVIDER_SITE_OTHER): Payer: BC Managed Care – PPO | Admitting: Gastroenterology

## 2014-01-25 VITALS — BP 100/60 | HR 64 | Ht 64.25 in | Wt 170.4 lb

## 2014-01-25 DIAGNOSIS — Z800 Family history of malignant neoplasm of digestive organs: Secondary | ICD-10-CM

## 2014-01-25 DIAGNOSIS — R1012 Left upper quadrant pain: Secondary | ICD-10-CM

## 2014-01-25 DIAGNOSIS — D649 Anemia, unspecified: Secondary | ICD-10-CM | POA: Insufficient documentation

## 2014-01-25 MED ORDER — HYOSCYAMINE SULFATE 0.125 MG SL SUBL: 0.2500 mg | SUBLINGUAL_TABLET | SUBLINGUAL | Status: DC | PRN

## 2014-01-25 MED ORDER — FERROUS SULFATE DRIED 200 (65 FE) MG PO TABS: ORAL_TABLET | ORAL | Status: DC

## 2014-01-25 NOTE — Assessment & Plan Note (Signed)
Anemia is probably related to menorrhagia.  Recommendations #1 begin iron supplementation

## 2014-01-25 NOTE — Assessment & Plan Note (Signed)
Plan colonoscopy every 5 years.  She is currently due for colonoscopy and will  schedule

## 2014-01-25 NOTE — Progress Notes (Signed)
_                                                                                                                History of Present Illness: Pleasant 43 year old white female referred for evaluation of abdominal pain and for family history of colon cancer.  For probably 9 months she has been complaining of very intermittent pain in the left upper quadrant in the subcostal area with radiation around to the back.  It seems to be postprandial and of low intensity.  She was evaluated by CT scan in August, 2014.  Exam was unremarkable.  She describes pain as "twinges" it is unrelated to twisting or turning.  It may last several days at a time. Lab work including urinalysis was also normal.  Last colonoscopy 2010 was normal except for hemorrhoids.  She has no lower GI complaints.  She does note heavy menstrual periods.    Past Medical History  Diagnosis Date  . Postpartum depression     prev on sertraline for same  . GERD (gastroesophageal reflux disease)    Past Surgical History  Procedure Laterality Date  . Cesarean section  02/2008 & 09/2005    x 2   family history includes Alcohol abuse in her other; Breast cancer in her paternal grandmother; Breast cancer (age of onset: 31) in her paternal aunt; Colon cancer (age of onset: 69) in her father; Crohn's disease in her mother; Diabetes in her paternal aunt and paternal grandmother; Heart Problems in her maternal uncle; Heart Problems (age of onset: 62) in her maternal grandfather; Hypertension in her father; Prostate cancer (age of onset: 24) in her father. Current Outpatient Prescriptions  Medication Sig Dispense Refill  . ALPRAZolam (XANAX) 0.5 MG tablet Take 1 tablet (0.5 mg total) by mouth 2 (two) times daily as needed for sleep or anxiety.  30 tablet  0  . omeprazole (PRILOSEC OTC) 20 MG tablet Take 20 mg by mouth as needed.      . cholecalciferol (VITAMIN D) 1000 UNITS tablet Take 1,000 Units by mouth daily.        . Multiple  Vitamin (MULTIVITAMIN) tablet Take 1 tablet by mouth daily.        . Omega-3 Fatty Acids (FISH OIL PO) Take by mouth.       No current facility-administered medications for this visit.   Allergies as of 01/25/2014 - Review Complete 01/25/2014  Allergen Reaction Noted  . Penicillins Hives   . Sulfonamide derivatives Hives     reports that she has never smoked. She has never used smokeless tobacco. She reports that she drinks alcohol. She reports that she does not use illicit drugs.     Review of Systems: Pertinent positive and negative review of systems were noted in the above HPI section. All other review of systems were otherwise negative.  Vital signs were reviewed in today's medical record Physical Exam: General: Well developed , well nourished, no acute distress Skin: anicteric Head: Normocephalic and atraumatic Eyes:  sclerae  anicteric, EOMI Ears: Normal auditory acuity Mouth: No deformity or lesions Neck: Supple, no masses or thyromegaly Lungs: Clear throughout to auscultation Heart: Regular rate and rhythm; no murmurs, rubs or bruits Abdomen: Soft, non tender and non distended. No masses, hepatosplenomegaly or hernias noted. Normal Bowel sounds Rectal:deferred Musculoskeletal: Symmetrical with no gross deformities  Skin: No lesions on visible extremities Pulses:  Normal pulses noted Extremities: No clubbing, cyanosis, edema or deformities noted Neurological: Alert oriented x 4, grossly nonfocal Cervical Nodes:  No significant cervical adenopathy Inguinal Nodes: No significant inguinal adenopathy Psychological:  Alert and cooperative. Normal mood and affect  See Assessment and Plan under Problem List

## 2014-01-25 NOTE — Patient Instructions (Signed)

## 2014-01-25 NOTE — Assessment & Plan Note (Signed)
Pain is nonspecific and could be musculoskeletal pain or possibly nonulcer dyspepsia.  Recommendations #1 trial of hyomax sublingual as needed

## 2014-02-25 ENCOUNTER — Telehealth: Payer: Self-pay | Admitting: Gastroenterology

## 2014-02-25 MED ORDER — NA SULFATE-K SULFATE-MG SULF 17.5-3.13-1.6 GM/177ML PO SOLN: 1.0000 | Freq: Once | ORAL | Status: DC

## 2014-02-25 NOTE — Telephone Encounter (Signed)
Prep sent

## 2014-02-27 LAB — HM MAMMOGRAPHY

## 2014-02-28 ENCOUNTER — Encounter: Payer: Self-pay | Admitting: Gastroenterology

## 2014-02-28 ENCOUNTER — Ambulatory Visit (AMBULATORY_SURGERY_CENTER): Payer: BC Managed Care – PPO | Admitting: Gastroenterology

## 2014-02-28 ENCOUNTER — Encounter: Payer: BC Managed Care – PPO | Admitting: Gastroenterology

## 2014-02-28 VITALS — BP 94/68 | HR 60 | Temp 98.7°F | Resp 25 | Ht 64.0 in | Wt 170.0 lb

## 2014-02-28 DIAGNOSIS — Z1211 Encounter for screening for malignant neoplasm of colon: Secondary | ICD-10-CM

## 2014-02-28 DIAGNOSIS — K648 Other hemorrhoids: Secondary | ICD-10-CM

## 2014-02-28 DIAGNOSIS — D126 Benign neoplasm of colon, unspecified: Secondary | ICD-10-CM

## 2014-02-28 DIAGNOSIS — Z800 Family history of malignant neoplasm of digestive organs: Secondary | ICD-10-CM

## 2014-02-28 MED ORDER — SODIUM CHLORIDE 0.9 % IV SOLN: 500.0000 mL | INTRAVENOUS | Status: DC

## 2014-02-28 NOTE — Progress Notes (Signed)
A/ox3, pleased with MAC, report to RN 

## 2014-02-28 NOTE — Patient Instructions (Signed)
YOU HAD AN ENDOSCOPIC PROCEDURE TODAY AT THE [facility]: Refer to the procedure report that was given to you for any specific questions about what was found during the examination.  If the procedure report does not answer your questions, please call your gastroenterologist to clarify.  If you requested that your care partner not be given the details of your procedure findings, then the procedure report has been included in a sealed envelope for you to review at your convenience later.  YOU SHOULD EXPECT: Some feelings of bloating in the abdomen. Passage of more gas than usual.  Walking can help get rid of the air that was put into your GI tract during the procedure and reduce the bloating. If you had a lower endoscopy (such as a colonoscopy or flexible sigmoidoscopy) you may notice spotting of blood in your stool or on the toilet paper. If you underwent a bowel prep for your procedure, then you may not have a normal bowel movement for a few days.  DIET: Your first meal following the procedure should be a light meal and then it is ok to progress to your normal diet.  A half-sandwich or bowl of soup is an example of a good first meal.  Heavy or fried foods are harder to digest and may make you feel nauseous or bloated.  Likewise meals heavy in dairy and vegetables can cause extra gas to form and this can also increase the bloating.  Drink plenty of fluids but you should avoid alcoholic beverages for 24 hours.  ACTIVITY: Your care partner should take you home directly after the procedure.  You should plan to take it easy, moving slowly for the rest of the day.  You can resume normal activity the day after the procedure however you should NOT DRIVE or use heavy machinery for 24 hours (because of the sedation medicines used during the test).    SYMPTOMS TO REPORT IMMEDIATELY: A gastroenterologist can be reached at any hour.  During normal business hours, 8:30 AM to 5:00 PM Monday through Friday,  call [phone].  After hours and on weekends, please call the GI answering service at [phone] who will take a message and have the physician on call contact you.   Following lower endoscopy (colonoscopy or flexible sigmoidoscopy):  Excessive amounts of blood in the stool  Significant tenderness or worsening of abdominal pains  Swelling of the abdomen that is new, acute  Fever of 100F or higher  FOLLOW UP: If any biopsies were taken you will be contacted by phone or by letter within the next 1-3 weeks.  Call your gastroenterologist if you have not heard about the biopsies in 3 weeks.  Our staff will call the home number listed on your records the next business day following your procedure to check on you and address any questions or concerns that you may have at that time regarding the information given to you following your procedure. This is a courtesy call and so if there is no answer at the home number and we have not heard from you through the emergency physician on call, we will assume that you have returned to your regular daily activities without incident.  SIGNATURES/CONFIDENTIALITY: You and/or your care partner have signed paperwork which will be entered into your electronic medical record.  These signatures attest to the fact that that the information above on your After Visit Summary has been reviewed and is understood.  Full responsibility of the confidentiality of this   discharge information lies with you and/or your care-partner.  Please, try to read the handouts given to you by your recovery room nurse.

## 2014-02-28 NOTE — Op Note (Signed)
[place]  [address] [place], [id number]   COLONOSCOPY PROCEDURE REPORT  PATIENT: Desire, Fulp  MR#: #[id number] BIRTHDATE: 09/25/1970 , 43  yrs. old GENDER: Female ENDOSCOPIST: Inda Castle, MD REFERRED BY: PROCEDURE DATE:  02/28/2014 PROCEDURE:   Colonoscopy with snare polypectomy First Screening Colonoscopy - Avg.  risk and is 50 yrs.  old or older - No.  Prior Negative Screening - Now for repeat screening. Above average risk  History of Adenoma - Now for follow-up colonoscopy & has been > or = to 3 yrs.  N/A  Polyps Removed Today? Yes. ASA CLASS:   Class II INDICATIONS:Patient's immediate family history of colon cancer. MEDICATIONS: MAC sedation, administered by CRNA and propofol (Diprivan) 350mg  IV  DESCRIPTION OF PROCEDURE:   After the risks benefits and alternatives of the procedure were thoroughly explained, informed consent was obtained.  A digital rectal exam revealed no abnormalities of the rectum.   The LB [id number]  endoscope was introduced through the anus and advanced to the cecum, which was identified by both the appendix and ileocecal valve. No adverse events experienced.   The quality of the prep was excellent using Suprep  The instrument was then slowly withdrawn as the colon was fully examined.      COLON FINDINGS: A flat polyp measuring 3 mm in size was found at the cecum.  A polypectomy was performed with a cold snare.  The resection was complete and the polyp tissue was completely retrieved.   The colon was otherwise normal.  There was no diverticulosis, inflammation, polyps or cancers unless previously stated.  Retroflexed views revealed no abnormalities. The time to cecum=5 minutes 04 seconds.  Withdrawal time=9 minutes 52 seconds. The scope was withdrawn and the procedure completed. COMPLICATIONS: There were no complications.  ENDOSCOPIC IMPRESSION: 1.   Flat polyp measuring 3 mm in size was found at the  cecum; polypectomy was performed with a cold snare 2.   The colon was otherwise normal  RECOMMENDATIONS: Given your significant family history of colon cancer, you should have a repeat colonoscopy in 5 years   eSigned:  Inda Castle, MD 02/28/2014 4:00 PM   cc: Rowe Clack, MD   PATIENT NAME:  Fairy, Ashlock MR#: #[id number]

## 2014-02-28 NOTE — Progress Notes (Signed)
Called to room to assist during endoscopic procedure.  Patient ID and intended procedure confirmed with present staff. Received instructions for my participation in the procedure from the performing physician.  

## 2014-03-01 ENCOUNTER — Telehealth: Payer: Self-pay

## 2014-03-01 ENCOUNTER — Other Ambulatory Visit: Payer: Self-pay | Admitting: Radiology

## 2014-03-01 LAB — HM MAMMOGRAPHY

## 2014-03-01 NOTE — Telephone Encounter (Signed)
  Follow up Call-  Call back number 02/28/2014  Post procedure Call Back phone  # [phone]  Permission to leave phone message Yes     Patient questions:  Do you have a fever, pain , or abdominal swelling? No. Pain Score  0 *  Have you tolerated food without any problems? Yes.    Have you been able to return to your normal activities? Yes.    Do you have any questions about your discharge instructions: Diet   No. Medications  No. Follow up visit  No.  Do you have questions or concerns about your Care? No.  Actions: * If pain score is 4 or above: No action needed, pain <4.  No problems per the pt. Maw

## 2014-03-04 ENCOUNTER — Telehealth: Payer: Self-pay

## 2014-03-04 LAB — HM MAMMOGRAPHY

## 2014-03-04 NOTE — Telephone Encounter (Signed)
Physician Order - Ultrasound Guided Biopsy

## 2014-03-07 ENCOUNTER — Encounter: Payer: Self-pay | Admitting: Gastroenterology

## 2014-03-08 ENCOUNTER — Encounter: Payer: Self-pay | Admitting: Internal Medicine

## 2014-03-11 ENCOUNTER — Encounter: Payer: Self-pay | Admitting: Internal Medicine

## 2014-03-15 ENCOUNTER — Encounter: Payer: Self-pay | Admitting: Internal Medicine

## 2014-09-24 ENCOUNTER — Ambulatory Visit (INDEPENDENT_AMBULATORY_CARE_PROVIDER_SITE_OTHER): Payer: BLUE CROSS/BLUE SHIELD | Admitting: Family

## 2014-09-24 ENCOUNTER — Encounter: Payer: Self-pay | Admitting: Family

## 2014-09-24 VITALS — BP 108/78 | Temp 99.9°F | Wt 173.0 lb

## 2014-09-24 DIAGNOSIS — J069 Acute upper respiratory infection, unspecified: Secondary | ICD-10-CM

## 2014-09-24 DIAGNOSIS — R05 Cough: Secondary | ICD-10-CM

## 2014-09-24 DIAGNOSIS — R059 Cough, unspecified: Secondary | ICD-10-CM

## 2014-09-24 MED ORDER — METHYLPREDNISOLONE 4 MG PO KIT: PACK | ORAL | Status: DC

## 2014-09-24 MED ORDER — AZITHROMYCIN 250 MG PO TABS: ORAL_TABLET | ORAL | Status: DC

## 2014-09-24 NOTE — Patient Instructions (Signed)
Upper Respiratory Infection, Adult An upper respiratory infection (URI) is also sometimes known as the common cold. The upper respiratory tract includes the nose, sinuses, throat, trachea, and bronchi. Bronchi are the airways leading to the lungs. Most people improve within 1 week, but symptoms can last up to 2 weeks. A residual cough may last even longer.  CAUSES Many different viruses can infect the tissues lining the upper respiratory tract. The tissues become irritated and inflamed and often become very moist. Mucus production is also common. A cold is contagious. You can easily spread the virus to others by oral contact. This includes kissing, sharing a glass, coughing, or sneezing. Touching your mouth or nose and then touching a surface, which is then touched by another person, can also spread the virus. SYMPTOMS  Symptoms typically develop 1 to 3 days after you come in contact with a cold virus. Symptoms vary from person to person. They may include:  Runny nose.  Sneezing.  Nasal congestion.  Sinus irritation.  Sore throat.  Loss of voice (laryngitis).  Cough.  Fatigue.  Muscle aches.  Loss of appetite.  Headache.  Low-grade fever. DIAGNOSIS  You might diagnose your own cold based on familiar symptoms, since most people get a cold 2 to 3 times a year. Your caregiver can confirm this based on your exam. Most importantly, your caregiver can check that your symptoms are not due to another disease such as strep throat, sinusitis, pneumonia, asthma, or epiglottitis. Blood tests, throat tests, and X-rays are not necessary to diagnose a common cold, but they may sometimes be helpful in excluding other more serious diseases. Your caregiver will decide if any further tests are required. RISKS AND COMPLICATIONS  You may be at risk for a more severe case of the common cold if you smoke cigarettes, have chronic heart disease (such as heart failure) or lung disease (such as asthma), or if  you have a weakened immune system. The very young and very old are also at risk for more serious infections. Bacterial sinusitis, middle ear infections, and bacterial pneumonia can complicate the common cold. The common cold can worsen asthma and chronic obstructive pulmonary disease (COPD). Sometimes, these complications can require emergency medical care and may be life-threatening. PREVENTION  The best way to protect against getting a cold is to practice good hygiene. Avoid oral or hand contact with people with cold symptoms. Wash your hands often if contact occurs. There is no clear evidence that vitamin C, vitamin E, echinacea, or exercise reduces the chance of developing a cold. However, it is always recommended to get plenty of rest and practice good nutrition. TREATMENT  Treatment is directed at relieving symptoms. There is no cure. Antibiotics are not effective, because the infection is caused by a virus, not by bacteria. Treatment may include:  Increased fluid intake. Sports drinks offer valuable electrolytes, sugars, and fluids.  Breathing heated mist or steam (vaporizer or shower).  Eating chicken soup or other clear broths, and maintaining good nutrition.  Getting plenty of rest.  Using gargles or lozenges for comfort.  Controlling fevers with ibuprofen or acetaminophen as directed by your caregiver.  Increasing usage of your inhaler if you have asthma. Zinc gel and zinc lozenges, taken in the first 24 hours of the common cold, can shorten the duration and lessen the severity of symptoms. Pain medicines may help with fever, muscle aches, and throat pain. A variety of non-prescription medicines are available to treat congestion and runny nose. Your caregiver   can make recommendations and may suggest nasal or lung inhalers for other symptoms.  HOME CARE INSTRUCTIONS   Only take over-the-counter or prescription medicines for pain, discomfort, or fever as directed by your  caregiver.  Use a warm mist humidifier or inhale steam from a shower to increase air moisture. This may keep secretions moist and make it easier to breathe.  Drink enough water and fluids to keep your urine clear or pale yellow.  Rest as needed.  Return to work when your temperature has returned to normal or as your caregiver advises. You may need to stay home longer to avoid infecting others. You can also use a face mask and careful hand washing to prevent spread of the virus. SEEK MEDICAL CARE IF:   After the first few days, you feel you are getting worse rather than better.  You need your caregiver's advice about medicines to control symptoms.  You develop chills, worsening shortness of breath, or brown or red sputum. These may be signs of pneumonia.  You develop yellow or brown nasal discharge or pain in the face, especially when you bend forward. These may be signs of sinusitis.  You develop a fever, swollen neck glands, pain with swallowing, or white areas in the back of your throat. These may be signs of strep throat. SEEK IMMEDIATE MEDICAL CARE IF:   You have a fever.  You develop severe or persistent headache, ear pain, sinus pain, or chest pain.  You develop wheezing, a prolonged cough, cough up blood, or have a change in your usual mucus (if you have chronic lung disease).  You develop sore muscles or a stiff neck. Document Released: 02/02/2001 Document Revised: 11/01/2011 Document Reviewed: 11/14/2013 [employer] Patient Information 2015 [employer]. This information is not intended to replace advice given to you by your health care provider. Make sure you discuss any questions you have with your health care provider.  

## 2014-09-24 NOTE — Progress Notes (Signed)
Subjective:    Patient ID: Andrea Wells, female    DOB: 10-12-1970, 44 y.o.   MRN: [id number]  HPI  44 year old white female, nonsmoker is in today with complaints of cough, congestion, low-grade fever off and on 10 days. Reports her husband had an similar symptoms and was put on an antibiotic that has resolved.   Review of Systems  Constitutional: Positive for fever and fatigue. Negative for chills.  HENT: Positive for congestion, postnasal drip and sneezing.   Respiratory: Positive for cough. Negative for wheezing.   Cardiovascular: Negative.   Genitourinary: Negative.   Musculoskeletal: Negative.   Allergic/Immunologic: Negative.   Psychiatric/Behavioral: Negative.   All other systems reviewed and are negative.  Past Medical History  Diagnosis Date  . Postpartum depression     prev on sertraline for same  . GERD (gastroesophageal reflux disease)     History   Social History  . Marital Status: Married    Spouse Name: N/A    Number of Children: 2  . Years of Education: N/A   Occupational History  . Not on file.   Social History Main Topics  . Smoking status: Never Smoker   . Smokeless tobacco: Never Used  . Alcohol Use: Yes     Comment: occasional  . Drug Use: No  . Sexual Activity: Not on file   Other Topics Concern  . 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 Cesarean section  02/2008 & 09/2005    x 2    Family History  Problem Relation Age of Onset  . Crohn's disease Mother   . Prostate cancer Father 33  . Colon cancer Father 23  . Hypertension Father   . Alcohol abuse Other   . Diabetes Paternal Aunt   . Breast cancer Paternal Grandmother   . Diabetes Paternal Grandmother   . Breast cancer Paternal Aunt 5  . Heart Problems Maternal Grandfather 50    complications of valve repair -  died intraop  . Heart Problems Maternal Uncle     open heart valve repair, died year after    Allergies  Allergen Reactions  . Penicillins Hives  . Sulfonamide Derivatives Hives    Current Outpatient Prescriptions on File Prior to Visit  Medication Sig Dispense Refill  . ALPRAZolam (XANAX) 0.5 MG tablet Take 1 tablet (0.5 mg total) by mouth 2 (two) times daily as needed for sleep or anxiety. 30 tablet 0  . cholecalciferol (VITAMIN D) 1000 UNITS tablet Take 1,000 Units by mouth daily.      . Multiple Vitamin (MULTIVITAMIN) tablet Take 1 tablet by mouth daily.      . Na Sulfate-K Sulfate-Mg Sulf (SUPREP BOWEL PREP) SOLN Take 1 kit by mouth once. 1 Bottle 0  . omeprazole (PRILOSEC OTC) 20 MG tablet Take 20 mg by mouth as needed.    . Ferrous Sulfate Dried (FEOSOL) 200 (65 FE) MG TABS Take one tab daily (Patient not taking: Reported on 09/24/2014) 30 tablet 12  . hyoscyamine (LEVSIN SL) 0.125 MG SL tablet Place 2 tablets (0.25 mg total) under the tongue every 4 (four) hours as needed. (Patient not taking: Reported on 09/24/2014) 30 tablet 0  . Omega-3 Fatty Acids (FISH OIL PO) Take by mouth.     No current facility-administered medications on file prior  to visit.    BP 108/78 mmHg  Temp(Src) 99.9 F (37.7 C) (Oral)  Wt 173 lb (78.472 kg)chart    Objective:   Physical Exam  Constitutional: She is oriented to person, place, and time. She appears well-developed and well-nourished.  HENT:  Left Ear: External ear normal.  Nose: Nose normal.  Mouth/Throat: Oropharynx is clear and moist.  Neck: Normal range of motion. Neck supple.  Cardiovascular: Normal rate, regular rhythm and normal heart sounds.   Pulmonary/Chest: Effort normal and breath sounds normal.  Musculoskeletal: Normal range of motion.  Neurological: She is alert and oriented to person, place, and time.  Skin: Skin is warm and dry.  Psychiatric: She has a normal mood and affect.          Assessment & Plan:  Andrea Wells was  seen today for cough and fever.  Diagnoses and associated orders for this visit:  Acute upper respiratory infection  Cough  Other Orders - azithromycin (ZITHROMAX) 250 MG tablet; As directed - methylPREDNISolone (MEDROL DOSEPAK) 4 MG tablet; follow package directions   printed prescription for azithromycin if her symptoms worsen over the next 3-4 days. Refill prescription for Medrol pack as directed. Over-the-counter cough medications. Call the office with any questions or concerns. Recheck a PSA scheduled and as needed.

## 2014-09-24 NOTE — Progress Notes (Signed)
Pre visit review using our clinic review tool, if applicable. No additional management support is needed unless otherwise documented below in the visit note. 

## 2014-09-25 ENCOUNTER — Ambulatory Visit (INDEPENDENT_AMBULATORY_CARE_PROVIDER_SITE_OTHER): Payer: BLUE CROSS/BLUE SHIELD | Admitting: Internal Medicine

## 2014-09-25 ENCOUNTER — Encounter: Payer: Self-pay | Admitting: Internal Medicine

## 2014-09-25 VITALS — BP 108/68 | HR 79 | Temp 98.5°F | Resp 16 | Ht 64.0 in | Wt 172.0 lb

## 2014-09-25 DIAGNOSIS — J209 Acute bronchitis, unspecified: Secondary | ICD-10-CM | POA: Insufficient documentation

## 2014-09-25 MED ORDER — HYDROCODONE-HOMATROPINE 5-1.5 MG/5ML PO SYRP: 5.0000 mL | ORAL_SOLUTION | Freq: Three times a day (TID) | ORAL | Status: DC | PRN

## 2014-09-25 NOTE — Assessment & Plan Note (Signed)
She was seen yesterday and was asked to start a medrol dose pak and zpak She did not start either over concerns about side effects She will not take the medrol dose pak She will start the Zpak for the infection and with start hycodan for the cough

## 2014-09-25 NOTE — Progress Notes (Signed)
   Subjective:    Patient ID: Andrea Wells, female    DOB: [birth year], 44 y.o.   MRN: [id number]  Cough This is a recurrent problem. The current episode started in the past 7 days. The problem has been gradually worsening. The problem occurs every few hours. The cough is productive of purulent sputum. Associated symptoms include chills, a fever, nasal congestion, postnasal drip and rhinorrhea. Pertinent negatives include no chest pain, ear congestion, ear pain, headaches, heartburn, hemoptysis, myalgias, rash, sore throat, shortness of breath, sweats, weight loss or wheezing. She has tried nothing for the symptoms. The treatment provided no relief. There is no history of asthma, bronchiectasis, bronchitis, COPD, emphysema, environmental allergies or pneumonia.  Sinusitis Associated symptoms include chills, coughing and sinus pressure. Pertinent negatives include no diaphoresis, ear pain, headaches, shortness of breath or sore throat.      Review of Systems  Constitutional: Positive for fever, chills and fatigue. Negative for weight loss, diaphoresis, activity change, appetite change and unexpected weight change.  HENT: Positive for postnasal drip, rhinorrhea and sinus pressure. Negative for ear pain, sore throat and trouble swallowing.   Eyes: Negative.   Respiratory: Positive for cough. Negative for apnea, hemoptysis, choking, chest tightness, shortness of breath, wheezing and stridor.   Cardiovascular: Negative.  Negative for chest pain, palpitations and leg swelling.  Gastrointestinal: Negative.  Negative for heartburn, nausea, abdominal pain and diarrhea.  Endocrine: Negative.   Genitourinary: Negative.   Musculoskeletal: Negative.  Negative for myalgias.  Skin: Negative.  Negative for rash.  Allergic/Immunologic: Negative.  Negative for environmental allergies.  Neurological: Negative.  Negative for headaches.  Hematological: Negative.  Negative for adenopathy. Does not bruise/bleed  easily.  Psychiatric/Behavioral: Negative.        Objective:   Physical Exam  Constitutional: She is oriented to person, place, and time. She appears well-developed and well-nourished.  Non-toxic appearance. She does not have a sickly appearance. She does not appear ill. No distress.  HENT:  Head: Normocephalic and atraumatic.  Mouth/Throat: Oropharynx is clear and moist. No oropharyngeal exudate.  Eyes: Conjunctivae are normal. Right eye exhibits no discharge. Left eye exhibits no discharge. No scleral icterus.  Neck: Normal range of motion. Neck supple. No JVD present. No tracheal deviation present. No thyromegaly present.  Cardiovascular: Normal rate, regular rhythm, normal heart sounds and intact distal pulses.  Exam reveals no gallop and no friction rub.   No murmur heard. Pulmonary/Chest: Effort normal and breath sounds normal. No stridor. No respiratory distress. She has no wheezes. She has no rales. She exhibits no tenderness.  Abdominal: Soft. Bowel sounds are normal. She exhibits no distension and no mass. There is no tenderness. There is no rebound and no guarding.  Musculoskeletal: Normal range of motion. She exhibits no edema or tenderness.  Lymphadenopathy:    She has no cervical adenopathy.  Neurological: She is oriented to person, place, and time.  Skin: Skin is warm and dry. No rash noted. She is not diaphoretic. No erythema. No pallor.  Vitals reviewed.         Assessment & Plan:

## 2014-09-25 NOTE — Patient Instructions (Signed)

## 2014-12-22 LAB — HM PAP SMEAR

## 2015-02-26 ENCOUNTER — Encounter: Payer: Self-pay | Admitting: Internal Medicine

## 2015-02-26 ENCOUNTER — Ambulatory Visit (INDEPENDENT_AMBULATORY_CARE_PROVIDER_SITE_OTHER): Payer: BLUE CROSS/BLUE SHIELD | Admitting: Internal Medicine

## 2015-02-26 ENCOUNTER — Ambulatory Visit: Payer: BLUE CROSS/BLUE SHIELD | Admitting: Family

## 2015-02-26 VITALS — BP 122/78 | HR 63 | Temp 98.4°F | Wt 179.2 lb

## 2015-02-26 DIAGNOSIS — H109 Unspecified conjunctivitis: Secondary | ICD-10-CM | POA: Diagnosis not present

## 2015-02-26 MED ORDER — ERYTHROMYCIN 5 MG/GM OP OINT: 1.0000 "application " | TOPICAL_OINTMENT | Freq: Four times a day (QID) | OPHTHALMIC | Status: DC

## 2015-02-26 NOTE — Progress Notes (Signed)
Subjective:  Patient ID: Andrea Wells, female    DOB: 1970-12-15  Age: 44 y.o. MRN: [id number]  CC: No chief complaint on file.   HPI Faviola Klare presents for L pink eye x 1 d. Using contacts - stopped yesterday. No blurred vision   Outpatient Prescriptions Prior to Visit  Medication Sig Dispense Refill  . ALPRAZolam (XANAX) 0.5 MG tablet Take 1 tablet (0.5 mg total) by mouth 2 (two) times daily as needed for sleep or anxiety. 30 tablet 0  . Multiple Vitamin (MULTIVITAMIN) tablet Take 1 tablet by mouth daily.      Marland Kitchen omeprazole (PRILOSEC OTC) 20 MG tablet Take 20 mg by mouth as needed.    Marland Kitchen azithromycin (ZITHROMAX) 250 MG tablet As directed (Patient not taking: Reported on 02/26/2015) 6 tablet 0  . cholecalciferol (VITAMIN D) 1000 UNITS tablet Take 1,000 Units by mouth daily.      . Ferrous Sulfate Dried (FEOSOL) 200 (65 FE) MG TABS Take one tab daily (Patient not taking: Reported on 02/26/2015) 30 tablet 12  . HYDROcodone-homatropine (HYCODAN) 5-1.5 MG/5ML syrup Take 5 mLs by mouth every 8 (eight) hours as needed for cough. (Patient not taking: Reported on 02/26/2015) 120 mL 0  . hyoscyamine (LEVSIN SL) 0.125 MG SL tablet Place 2 tablets (0.25 mg total) under the tongue every 4 (four) hours as needed. (Patient not taking: Reported on 02/26/2015) 30 tablet 0  . Na Sulfate-K Sulfate-Mg Sulf (SUPREP BOWEL PREP) SOLN Take 1 kit by mouth once. (Patient not taking: Reported on 02/26/2015) 1 Bottle 0  . Omega-3 Fatty Acids (FISH OIL PO) Take by mouth.     No facility-administered medications prior to visit.    ROS Review of Systems  Constitutional: Negative for fever and chills.  HENT: Negative for congestion, ear pain and sinus pressure.   Eyes: Positive for pain, discharge and redness. Negative for photophobia, itching and visual disturbance.    Objective:  BP 122/78 mmHg  Pulse 63  Temp(Src) 98.4 F (36.9 C)  Wt 179 lb 4 oz (81.307 kg)  SpO2 98%  BP Readings from Last 3 Encounters:    02/26/15 122/78  09/25/14 108/68  09/24/14 108/78    Wt Readings from Last 3 Encounters:  02/26/15 179 lb 4 oz (81.307 kg)  09/25/14 172 lb (78.019 kg)  09/24/14 173 lb (78.472 kg)    Physical Exam  Constitutional: She appears well-nourished. No distress.  HENT:  Right Ear: External ear normal.  Left Ear: External ear normal.  Nose: Nose normal.  Mouth/Throat: Oropharynx is clear and moist.  Eyes: Right eye exhibits discharge. No scleral icterus.  Neck: No thyromegaly present.  Skin: No rash noted. She is not diaphoretic.    Lab Results  Component Value Date   WBC 6.7 12/05/2013   HGB 11.1* 12/05/2013   HCT 32.9* 12/05/2013   PLT 295.0 12/05/2013   GLUCOSE 88 12/05/2013   CHOL 213* 01/16/2013   TRIG 94.0 01/16/2013   HDL 49.90 01/16/2013   LDLDIRECT 150.7 01/16/2013   ALT 12 12/05/2013   AST 17 12/05/2013   NA 137 12/05/2013   K 4.1 12/05/2013   CL 107 12/05/2013   CREATININE 0.6 12/05/2013   BUN 11 12/05/2013   CO2 26 12/05/2013   TSH 1.42 12/05/2013    Ct Abdomen Pelvis Wo Contrast  04/06/2013   *RADIOLOGY REPORT*  Clinical Data: Left flank pain x6 weeks  CT ABDOMEN AND PELVIS WITHOUT CONTRAST  Technique:  Multidetector CT imaging of the abdomen  and pelvis was performed following the standard protocol without intravenous contrast.  Comparison: None.  Findings: Lung bases are clear.  Unenhanced liver, spleen, pancreas, and adrenal glands are within normal limits.  Gallbladder is notable for possible layering sludge.  No intrahepatic or extrahepatic ductal dilatation.  Kidneys are within normal limits.  No renal calculi or hydronephrosis.  No evidence of bowel obstruction.  Normal appendix.  No evidence of abdominal aortic aneurysm.  No suspicious abdominopelvic lymphadenopathy.  Uterus and bilateral ovaries are unremarkable.  Trace pelvic ascites, likely physiologic.  No ureteral or bladder calculi.  Bladder is unremarkable.  Mild degenerative changes of the  visualized thoracolumbar spine.  IMPRESSION: No renal, ureteral, or bladder calculi.  No hydronephrosis.  No CT findings to account for the patient's left flank pain.   Original Report Authenticated By: Julian Hy, M.D.    Assessment & Plan:   Diagnoses and all orders for this visit:  Conjunctivitis of right eye  Other orders -     erythromycin ophthalmic ointment; Place 1 application into the right eye 4 (four) times daily.   I am having Ms. Puls start on erythromycin. I am also having her maintain her multivitamin, cholecalciferol, Omega-3 Fatty Acids (FISH OIL PO), omeprazole, ALPRAZolam, hyoscyamine, Ferrous Sulfate Dried, Na Sulfate-K Sulfate-Mg Sulf, azithromycin, and HYDROcodone-homatropine.  Meds ordered this encounter  Medications  . erythromycin ophthalmic ointment    Sig: Place 1 application into the right eye 4 (four) times daily.    Dispense:  3.5 g    Refill:  0     Follow-up: No Follow-up on file.  Walker Kehr, MD

## 2015-02-26 NOTE — Progress Notes (Signed)
Pre visit review using our clinic review tool, if applicable. No additional management support is needed unless otherwise documented below in the visit note. 

## 2015-02-26 NOTE — Assessment & Plan Note (Addendum)
R eye - ?bacterial No contacts x 1-2 weeks Erythro oint Call if not better

## 2015-03-03 LAB — HM MAMMOGRAPHY

## 2015-03-13 ENCOUNTER — Encounter: Payer: Self-pay | Admitting: Internal Medicine

## 2015-05-08 ENCOUNTER — Encounter: Payer: BLUE CROSS/BLUE SHIELD | Admitting: Internal Medicine

## 2015-08-12 ENCOUNTER — Encounter: Payer: Self-pay | Admitting: Internal Medicine

## 2015-08-12 ENCOUNTER — Ambulatory Visit (INDEPENDENT_AMBULATORY_CARE_PROVIDER_SITE_OTHER): Payer: BLUE CROSS/BLUE SHIELD | Admitting: Internal Medicine

## 2015-08-12 ENCOUNTER — Other Ambulatory Visit (INDEPENDENT_AMBULATORY_CARE_PROVIDER_SITE_OTHER): Payer: BLUE CROSS/BLUE SHIELD

## 2015-08-12 VITALS — BP 108/76 | HR 79 | Temp 98.8°F | Ht 64.0 in | Wt 176.0 lb

## 2015-08-12 DIAGNOSIS — B9789 Other viral agents as the cause of diseases classified elsewhere: Secondary | ICD-10-CM

## 2015-08-12 DIAGNOSIS — J069 Acute upper respiratory infection, unspecified: Secondary | ICD-10-CM

## 2015-08-12 DIAGNOSIS — Z0000 Encounter for general adult medical examination without abnormal findings: Secondary | ICD-10-CM

## 2015-08-12 LAB — BASIC METABOLIC PANEL
BUN: 9 mg/dL (ref 6–23)
CHLORIDE: 105 meq/L (ref 96–112)
CO2: 25 mEq/L (ref 19–32)
Calcium: 9.6 mg/dL (ref 8.4–10.5)
Creatinine, Ser: 0.66 mg/dL (ref 0.40–1.20)
GFR: 103.65 mL/min (ref >60.00)
Glucose, Bld: 102 mg/dL — ABNORMAL HIGH (ref 70–99)
POTASSIUM: 4.3 meq/L (ref 3.5–5.1)
Sodium: 139 mEq/L (ref 135–145)

## 2015-08-12 LAB — CBC WITH DIFFERENTIAL/PLATELET
BASOS PCT: 0.6 % (ref 0.0–3.0)
Basophils Absolute: 0 10*3/uL (ref 0.0–0.1)
EOS ABS: 0 10*3/uL (ref 0.0–0.7)
EOS PCT: 0.4 % (ref 0.0–5.0)
HEMATOCRIT: 34.7 % — AB (ref 36.0–46.0)
HEMOGLOBIN: 11.3 g/dL — AB (ref 12.0–15.0)
LYMPHS PCT: 16.8 % (ref 12.0–46.0)
Lymphs Abs: 1 10*3/uL (ref 0.7–4.0)
MCHC: 32.6 g/dL (ref 30.0–36.0)
MCV: 75.8 fl — ABNORMAL LOW (ref 78.0–100.0)
MONO ABS: 0.5 10*3/uL (ref 0.1–1.0)
Monocytes Relative: 7.8 % (ref 3.0–12.0)
Neutro Abs: 4.7 10*3/uL (ref 1.4–7.7)
Neutrophils Relative %: 74.4 % (ref 43.0–77.0)
Platelets: 365 10*3/uL (ref 150.0–400.0)
RBC: 4.58 Mil/uL (ref 3.87–5.11)
RDW: 14.9 % (ref 11.5–15.5)
WBC: 6.3 10*3/uL (ref 4.0–10.5)

## 2015-08-12 LAB — URINALYSIS, ROUTINE W REFLEX MICROSCOPIC
BILIRUBIN URINE: NEGATIVE
Ketones, ur: NEGATIVE
Leukocytes, UA: NEGATIVE
Nitrite: NEGATIVE
PH: 6 (ref 5.0–8.0)
SPECIFIC GRAVITY, URINE: 1.025 (ref 1.000–1.030)
TOTAL PROTEIN, URINE-UPE24: NEGATIVE
UROBILINOGEN UA: 0.2 (ref 0.0–1.0)
Urine Glucose: NEGATIVE

## 2015-08-12 LAB — LIPID PANEL
CHOL/HDL RATIO: 4
Cholesterol: 193 mg/dL (ref 0–200)
HDL: 45.4 mg/dL (ref >39.00)
LDL CALC: 124 mg/dL — AB (ref 0–99)
NonHDL: 147.86
Triglycerides: 118 mg/dL (ref 0.0–149.0)
VLDL: 23.6 mg/dL (ref 0.0–40.0)

## 2015-08-12 LAB — HEPATIC FUNCTION PANEL
ALT: 15 U/L (ref 0–35)
AST: 18 U/L (ref 0–37)
Albumin: 4.2 g/dL (ref 3.5–5.2)
Alkaline Phosphatase: 53 U/L (ref 39–117)
BILIRUBIN DIRECT: 0.1 mg/dL (ref 0.0–0.3)
BILIRUBIN TOTAL: 0.3 mg/dL (ref 0.2–1.2)
TOTAL PROTEIN: 7.8 g/dL (ref 6.0–8.3)

## 2015-08-12 LAB — VITAMIN D 25 HYDROXY (VIT D DEFICIENCY, FRACTURES): VITD: 21.04 ng/mL — ABNORMAL LOW (ref 30.00–100.00)

## 2015-08-12 LAB — TSH: TSH: 1.13 u[IU]/mL (ref 0.35–4.50)

## 2015-08-12 MED ORDER — ALPRAZOLAM 0.5 MG PO TABS: 0.5000 mg | ORAL_TABLET | Freq: Two times a day (BID) | ORAL | Status: DC | PRN

## 2015-08-12 NOTE — Patient Instructions (Signed)
It was good to see you today.  We have reviewed your prior records including labs and tests today  Health Maintenance reviewed - all recommended immunizations and age-appropriate screenings are up-to-date.  Test(s) ordered today. Your results will be released to [facility] (or called to you) after review, usually within 72hours after test completion. If any changes need to be made, you will be notified at that same time.  Medications reviewed and updated, no changes recommended at this time. Refill on medication(s) as discussed today.  Please schedule followup in 12 months for annual exam and labs, call sooner if problems.

## 2015-08-12 NOTE — Progress Notes (Signed)
Subjective:    Patient ID: Andrea Wells, female    DOB: 29-Apr-1971, 44 y.o.   MRN: [id number]  HPI  patient is here today for annual physical. Patient feels well in general Also reviewed chronic medical conditions, interval events and current concerns   Past Medical History  Diagnosis Date  . Postpartum depression     prev on sertraline for same  . GERD (gastroesophageal reflux disease)    Family History  Problem Relation Age of Onset  . Crohn's disease Mother     ? dx, improved w/ gluten free diet  . Prostate cancer Father 5  . Colon cancer Father 3  . Hypertension Father   . Alcohol abuse Other   . Diabetes Paternal Aunt   . Breast cancer Paternal Grandmother   . Diabetes Paternal Grandmother   . Breast cancer Paternal Aunt 50  . Heart Problems Maternal Grandfather 50    complications of valve repair - died intraop  . Heart Problems Maternal Uncle     open heart valve repair, died year after   Social History  Substance Use Topics  . Smoking status: Never Smoker   . Smokeless tobacco: Never Used  . Alcohol Use: Yes     Comment: occasional    Review of Systems  Constitutional: Negative for fatigue and unexpected weight change. Fever: ? last 24h.  HENT: Positive for congestion.   Respiratory: Positive for cough (x 4 days - slightly productive). Negative for shortness of breath and wheezing.   Cardiovascular: Negative for chest pain, palpitations and leg swelling.  Gastrointestinal: Negative for nausea, abdominal pain and diarrhea.  Neurological: Negative for dizziness, weakness, light-headedness and headaches.  Psychiatric/Behavioral: Negative for dysphoric mood. The patient is not nervous/anxious.   All other systems reviewed and are negative.      Objective:    Physical Exam  Constitutional: She is oriented to person, place, and time. She appears well-developed and well-nourished. No distress.  obese  HENT:  Head: Normocephalic and atraumatic.  Right  Ear: External ear normal.  Left Ear: External ear normal.  Nose: Nose normal.  Mouth/Throat: Oropharynx is clear and moist. No oropharyngeal exudate.  Eyes: EOM are normal. Pupils are equal, round, and reactive to light. Right eye exhibits no discharge. Left eye exhibits no discharge. No scleral icterus.  Neck: Normal range of motion. Neck supple. No JVD present. No tracheal deviation present. No thyromegaly present.  Cardiovascular: Normal rate, regular rhythm, normal heart sounds and intact distal pulses.  Exam reveals no friction rub.   No murmur heard. Pulmonary/Chest: Effort normal and breath sounds normal. No respiratory distress. She has no wheezes. She has no rales. She exhibits no tenderness.  Abdominal: Soft. Bowel sounds are normal. She exhibits no distension and no mass. There is no tenderness. There is no rebound and no guarding.  Musculoskeletal: Normal range of motion.  Lymphadenopathy:    She has no cervical adenopathy.  Neurological: She is alert and oriented to person, place, and time. She has normal reflexes. No cranial nerve deficit.  Skin: Skin is warm and dry. No rash noted. She is not diaphoretic. No erythema.  Psychiatric: She has a normal mood and affect. Her behavior is normal. Judgment and thought content normal.  Nursing note and vitals reviewed.   BP 108/76 mmHg  Pulse 79  Temp(Src) 98.8 F (37.1 C) (Oral)  Ht 5\' 4"  (1.626 m)  Wt 176 lb (79.833 kg)  BMI 30.20 kg/m2  SpO2 97%  LMP 07/30/2015 Wt  Readings from Last 3 Encounters:  08/12/15 176 lb (79.833 kg)  02/26/15 179 lb 4 oz (81.307 kg)  09/25/14 172 lb (78.019 kg)    Lab Results  Component Value Date   WBC 6.7 12/05/2013   HGB 11.1* 12/05/2013   HCT 32.9* 12/05/2013   PLT 295.0 12/05/2013   GLUCOSE 88 12/05/2013   CHOL 213* 01/16/2013   TRIG 94.0 01/16/2013   HDL 49.90 01/16/2013   LDLDIRECT 150.7 01/16/2013   ALT 12 12/05/2013   AST 17 12/05/2013   NA 137 12/05/2013   K 4.1 12/05/2013    CL 107 12/05/2013   CREATININE 0.6 12/05/2013   BUN 11 12/05/2013   CO2 26 12/05/2013   TSH 1.42 12/05/2013    Ct Abdomen Pelvis Wo Contrast  04/06/2013  *RADIOLOGY REPORT* Clinical Data: Left flank pain x6 weeks CT ABDOMEN AND PELVIS WITHOUT CONTRAST Technique:  Multidetector CT imaging of the abdomen and pelvis was performed following the standard protocol without intravenous contrast. Comparison: None. Findings: Lung bases are clear. Unenhanced liver, spleen, pancreas, and adrenal glands are within normal limits. Gallbladder is notable for possible layering sludge.  No intrahepatic or extrahepatic ductal dilatation. Kidneys are within normal limits.  No renal calculi or hydronephrosis. No evidence of bowel obstruction.  Normal appendix. No evidence of abdominal aortic aneurysm. No suspicious abdominopelvic lymphadenopathy. Uterus and bilateral ovaries are unremarkable. Trace pelvic ascites, likely physiologic. No ureteral or bladder calculi. Bladder is unremarkable. Mild degenerative changes of the visualized thoracolumbar spine. IMPRESSION: No renal, ureteral, or bladder calculi.  No hydronephrosis. No CT findings to account for the patient's left flank pain. Original Report Authenticated By: Julian Hy, M.D.       Assessment & Plan:   CPX/z00.00 - Patient has been counseled on age-appropriate routine health concerns for screening and prevention. These are reviewed and up-to-date. Immunizations are up-to-date or declined. Labs ordered and reviewed.  Viral URI - explained Wells of efficacy of antibiotics in viral disease - symptomatic care recommended: antitussive and nasal decongestants/steroids for sinusitis; hydration and rest with tylenol or ibuprofen as needed for headache and myalgia symptoms  - pt to call if symptoms worse or unimproved  Problem List Items Addressed This Visit    None    Visit Diagnoses    Routine general medical examination at a health care facility    -   Primary    Relevant Orders    Basic metabolic panel    CBC with Differential/Platelet    Hepatic function panel    Lipid panel    TSH    Urinalysis, Routine w reflex microscopic (not at [facility])    VITAMIN D 25 Hydroxy (Vit-D Deficiency, Fractures)    Viral URI with cough            Gwendolyn Grant, MD

## 2015-12-05 DIAGNOSIS — F4323 Adjustment disorder with mixed anxiety and depressed mood: Secondary | ICD-10-CM | POA: Diagnosis not present

## 2015-12-12 DIAGNOSIS — Z01419 Encounter for gynecological examination (general) (routine) without abnormal findings: Secondary | ICD-10-CM | POA: Diagnosis not present

## 2015-12-12 DIAGNOSIS — Z6830 Body mass index (BMI) 30.0-30.9, adult: Secondary | ICD-10-CM | POA: Diagnosis not present

## 2015-12-12 DIAGNOSIS — Z30011 Encounter for initial prescription of contraceptive pills: Secondary | ICD-10-CM | POA: Diagnosis not present

## 2015-12-12 DIAGNOSIS — Z1389 Encounter for screening for other disorder: Secondary | ICD-10-CM | POA: Diagnosis not present

## 2015-12-12 DIAGNOSIS — D649 Anemia, unspecified: Secondary | ICD-10-CM | POA: Diagnosis not present

## 2015-12-12 DIAGNOSIS — N920 Excessive and frequent menstruation with regular cycle: Secondary | ICD-10-CM | POA: Diagnosis not present

## 2015-12-12 DIAGNOSIS — Z130 Encounter for screening for diseases of the blood and blood-forming organs and certain disorders involving the immune mechanism: Secondary | ICD-10-CM | POA: Diagnosis not present

## 2015-12-15 DIAGNOSIS — N920 Excessive and frequent menstruation with regular cycle: Secondary | ICD-10-CM | POA: Diagnosis not present

## 2015-12-15 DIAGNOSIS — D500 Iron deficiency anemia secondary to blood loss (chronic): Secondary | ICD-10-CM | POA: Diagnosis not present

## 2015-12-17 DIAGNOSIS — N939 Abnormal uterine and vaginal bleeding, unspecified: Secondary | ICD-10-CM | POA: Diagnosis not present

## 2015-12-17 DIAGNOSIS — N920 Excessive and frequent menstruation with regular cycle: Secondary | ICD-10-CM | POA: Diagnosis not present

## 2016-01-29 DIAGNOSIS — F4323 Adjustment disorder with mixed anxiety and depressed mood: Secondary | ICD-10-CM | POA: Diagnosis not present

## 2016-02-26 DIAGNOSIS — Z1231 Encounter for screening mammogram for malignant neoplasm of breast: Secondary | ICD-10-CM | POA: Diagnosis not present

## 2016-02-27 DIAGNOSIS — F4323 Adjustment disorder with mixed anxiety and depressed mood: Secondary | ICD-10-CM | POA: Diagnosis not present

## 2016-03-03 DIAGNOSIS — Z130 Encounter for screening for diseases of the blood and blood-forming organs and certain disorders involving the immune mechanism: Secondary | ICD-10-CM | POA: Diagnosis not present

## 2016-03-03 DIAGNOSIS — Z3041 Encounter for surveillance of contraceptive pills: Secondary | ICD-10-CM | POA: Diagnosis not present

## 2016-03-12 DIAGNOSIS — F4323 Adjustment disorder with mixed anxiety and depressed mood: Secondary | ICD-10-CM | POA: Diagnosis not present

## 2016-04-30 DIAGNOSIS — F4323 Adjustment disorder with mixed anxiety and depressed mood: Secondary | ICD-10-CM | POA: Diagnosis not present

## 2016-05-17 DIAGNOSIS — F4323 Adjustment disorder with mixed anxiety and depressed mood: Secondary | ICD-10-CM | POA: Diagnosis not present

## 2016-05-28 DIAGNOSIS — F4323 Adjustment disorder with mixed anxiety and depressed mood: Secondary | ICD-10-CM | POA: Diagnosis not present

## 2016-06-11 DIAGNOSIS — F4323 Adjustment disorder with mixed anxiety and depressed mood: Secondary | ICD-10-CM | POA: Diagnosis not present

## 2016-06-25 DIAGNOSIS — F4323 Adjustment disorder with mixed anxiety and depressed mood: Secondary | ICD-10-CM | POA: Diagnosis not present

## 2016-07-09 DIAGNOSIS — F4323 Adjustment disorder with mixed anxiety and depressed mood: Secondary | ICD-10-CM | POA: Diagnosis not present

## 2016-07-09 DIAGNOSIS — Z23 Encounter for immunization: Secondary | ICD-10-CM | POA: Diagnosis not present

## 2016-07-23 DIAGNOSIS — F4323 Adjustment disorder with mixed anxiety and depressed mood: Secondary | ICD-10-CM | POA: Diagnosis not present

## 2016-08-06 DIAGNOSIS — F4323 Adjustment disorder with mixed anxiety and depressed mood: Secondary | ICD-10-CM | POA: Diagnosis not present

## 2016-08-10 ENCOUNTER — Encounter: Payer: Self-pay | Admitting: Internal Medicine

## 2016-08-10 DIAGNOSIS — K219 Gastro-esophageal reflux disease without esophagitis: Secondary | ICD-10-CM | POA: Insufficient documentation

## 2016-08-10 NOTE — Progress Notes (Signed)
Subjective:    Patient ID: Andrea Wells, female    DOB: 1971/07/09, 45 y.o.   MRN: [id number]  HPI She is here for a physical exam.   She has very heavy periods and is anemic.   She goes to gyn next door.  She had a biopsy for endometrial thickness.  The biopsy was normal.  She is on birth control.  She is debating ablation vs birth control vs living with the anemia and taking iron.  She is concerned about the recent study regarding birth control and the risk for breast cancer.   Side pain:  For the past 6 weeks she has pain on her left lower rib cage and it radiates downward.  She has had increased gas.  She also had muscle pulls in her back from a work out.  It is intermittent.  Sometimes worse with eating.  Sometimes worse with sitting. Sometimes changing positions in bed will relieve pain. Her digestion seems off.   Medications and allergies reviewed with patient and updated if appropriate.  Patient Active Problem List   Diagnosis Date Noted  . GERD (gastroesophageal reflux disease) 08/10/2016  . Family history of malignant neoplasm of gastrointestinal tract 01/25/2014  . Anemia 01/25/2014  . Anxiety 06/04/2013  . Postpartum depression   . INTERNAL HEMORRHOIDS WITHOUT MENTION COMP 04/10/2009    Current Outpatient Prescriptions on File Prior to Visit  Medication Sig Dispense Refill  . ALPRAZolam (XANAX) 0.5 MG tablet Take 1 tablet (0.5 mg total) by mouth 2 (two) times daily as needed for sleep or anxiety. 30 tablet 0  . Multiple Vitamin (MULTIVITAMIN) tablet Take 1 tablet by mouth daily. Reported on 08/12/2015    . Omega-3 Fatty Acids (FISH OIL PO) Take by mouth. Reported on 08/12/2015     No current facility-administered medications on file prior to visit.     Past Medical History:  Diagnosis Date  . GERD (gastroesophageal reflux disease)   . Postpartum depression    prev on sertraline for same    Past Surgical History:  Procedure Laterality Date  . CESAREAN SECTION   02/2008 & 09/2005   x 2    Social History   Social History  . Marital status: Married    Spouse name: N/A  . Number of children: 2  . Years of education: N/A   Social History Main Topics  . Smoking status: Never Smoker  . Smokeless tobacco: Never Used  . Alcohol use Yes     Comment: occasional  . Drug use: No  . Sexual activity: Not Asked   Other Topics Concern  . 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 Crohn's disease Mother     ? dx, improved w/ gluten free diet  . Prostate cancer Father 49  . Colon cancer Father 42  . Hypertension Father   . Alcohol abuse Other   . Diabetes Paternal Aunt   . Breast cancer Paternal Aunt 64  . Breast cancer Paternal Grandmother   . Diabetes Paternal Grandmother   . Heart Problems Maternal Grandfather 50    complications of valve repair - died intraop  . Heart Problems Maternal Uncle     open heart valve repair, died year after    Review  of Systems  Constitutional: Negative for chills and unexpected weight change.  Eyes: Negative for visual disturbance.  Respiratory: Negative for cough, shortness of breath and wheezing.   Cardiovascular: Positive for palpitations (occ flutter). Negative for chest pain and leg swelling.  Gastrointestinal: Positive for abdominal pain (LUQ). Negative for blood in stool, constipation, diarrhea and nausea.  Genitourinary: Positive for menstrual problem (heavy periods). Negative for dysuria and hematuria.  Musculoskeletal: Negative for arthralgias and back pain.  Skin: Negative for color change and rash.  Neurological: Negative for light-headedness and headaches.  Psychiatric/Behavioral: Negative for dysphoric mood. The patient is nervous/anxious (situational).        Objective:   Vitals:   08/11/16  0809  BP: 124/70  Pulse: 80  Resp: 16  Temp: 98.6 F (37 C)   Filed Weights   08/11/16 0809  Weight: 177 lb (80.3 kg)   Body mass index is 30.38 kg/m.   Physical Exam Constitutional: She appears well-developed and well-nourished. No distress.  HENT:  Head: Normocephalic and atraumatic.  Right Ear: External ear normal. Normal ear canal and TM Left Ear: External ear normal.  Normal ear canal and TM Mouth/Throat: Oropharynx is clear and moist.  Eyes: Conjunctivae and EOM are normal.  Neck: Neck supple. No tracheal deviation present. No thyromegaly present.  No carotid bruit  Cardiovascular: Normal rate, regular rhythm and normal heart sounds.   No murmur heard.  No edema. Pulmonary/Chest: Effort normal and breath sounds normal. No respiratory distress. She has no wheezes. She has no rales. no tenderness on left lower rib cage Breast: deferred to Gyn Abdominal: Soft. She exhibits no distension. There is no tenderness.  no HSM Lymphadenopathy: She has no cervical adenopathy.  Skin: Skin is warm and dry. She is not diaphoretic.  Psychiatric: She has a normal mood and affect. Her behavior is normal.         Assessment & Plan:   Physical exam: Screening blood work  ordered Immunizations  Up to date  Colonoscopy - done 2015 ( family history of colon cancer) Mammogram  Up to date  Gyn     Up to date  Exercise  Exercises every other day Weight - overweight - knows she should decrease portions Skin no concerns, sees derm annually Substance abuse  none  See Problem List for Assessment and Plan of chronic medical problems.  F/u annually, sooner if needed

## 2016-08-10 NOTE — Patient Instructions (Addendum)
Test(s) ordered today. Your results will be released to [place] (or called to you) after review, usually within 72hours after test completion. If any changes need to be made, you will be notified at that same time.  All other Health Maintenance issues reviewed.   All recommended immunizations and age-appropriate screenings are up-to-date or discussed.  No immunizations administered today.   Medications reviewed and updated.  Changes include stopping omeprazole.  Start Zantac 150 mg at bedtime daily or as needed for heartburn.   Your prescription(s) have been submitted to your pharmacy. Please take as directed and contact our office if you believe you are having problem(s) with the medication(s).   Please followup in one year for a physical   Health Maintenance, Female Introduction Adopting a healthy lifestyle and getting preventive care can go a long way to promote health and wellness. Talk with your health care provider about what schedule of regular examinations is right for you. This is a good chance for you to check in with your provider about disease prevention and staying healthy. In between checkups, there are plenty of things you can do on your own. Experts have done a lot of research about which lifestyle changes and preventive measures are most likely to keep you healthy. Ask your health care provider for more information. Weight and diet Eat a healthy diet  Be sure to include plenty of vegetables, fruits, low-fat dairy products, and lean protein.  Do not eat a lot of foods high in solid fats, added sugars, or salt.  Get regular exercise. This is one of the most important things you can do for your health.  Most adults should exercise for at least 150 minutes each week. The exercise should increase your heart rate and make you sweat (moderate-intensity exercise).  Most adults should also do strengthening exercises at least twice a week. This is in addition to the  moderate-intensity exercise. Maintain a healthy weight  Body mass index (BMI) is a measurement that can be used to identify possible weight problems. It estimates body fat based on height and weight. Your health care provider can help determine your BMI and help you achieve or maintain a healthy weight.  For females 74 years of age and older:  A BMI below 18.5 is considered underweight.  A BMI of 18.5 to 24.9 is normal.  A BMI of 25 to 29.9 is considered overweight.  A BMI of 30 and above is considered obese. Watch levels of cholesterol and blood lipids  You should start having your blood tested for lipids and cholesterol at 44 years of age, then have this test every 5 years.  You may need to have your cholesterol levels checked more often if:  Your lipid or cholesterol levels are high.  You are older than 44 years of age.  You are at high risk for heart disease. Cancer screening Lung Cancer  Lung cancer screening is recommended for adults 67-22 years old who are at high risk for lung cancer because of a history of smoking.  A yearly low-dose CT scan of the lungs is recommended for people who:  Currently smoke.  Have quit within the past 15 years.  Have at least a 30-pack-year history of smoking. A pack year is smoking an average of one pack of cigarettes a day for 1 year.  Yearly screening should continue until it has been 15 years since you quit.  Yearly screening should stop if you develop a health problem that would prevent  you from having lung cancer treatment. Breast Cancer  Practice breast self-awareness. This means understanding how your breasts normally appear and feel.  It also means doing regular breast self-exams. Let your health care provider know about any changes, no matter how small.  If you are in your 20s or 30s, you should have a clinical breast exam (CBE) by a health care provider every 1-3 years as part of a regular health exam.  If you are 48 or  older, have a CBE every year. Also consider having a breast X-ray (mammogram) every year.  If you have a family history of breast cancer, talk to your health care provider about genetic screening.  If you are at high risk for breast cancer, talk to your health care provider about having an MRI and a mammogram every year.  Breast cancer gene (BRCA) assessment is recommended for women who have family members with BRCA-related cancers. BRCA-related cancers include:  Breast.  Ovarian.  Tubal.  Peritoneal cancers.  Results of the assessment will determine the need for genetic counseling and BRCA1 and BRCA2 testing. Cervical Cancer  Your health care provider may recommend that you be screened regularly for cancer of the pelvic organs (ovaries, uterus, and vagina). This screening involves a pelvic examination, including checking for microscopic changes to the surface of your cervix (Pap test). You may be encouraged to have this screening done every 3 years, beginning at age 12.  For women ages 68-65, health care providers may recommend pelvic exams and Pap testing every 3 years, or they may recommend the Pap and pelvic exam, combined with testing for human papilloma virus (HPV), every 5 years. Some types of HPV increase your risk of cervical cancer. Testing for HPV may also be done on women of any age with unclear Pap test results.  Other health care providers may not recommend any screening for nonpregnant women who are considered low risk for pelvic cancer and who do not have symptoms. Ask your health care provider if a screening pelvic exam is right for you.  If you have had past treatment for cervical cancer or a condition that could lead to cancer, you need Pap tests and screening for cancer for at least 20 years after your treatment. If Pap tests have been discontinued, your risk factors (such as having a new sexual partner) need to be reassessed to determine if screening should resume. Some  women have medical problems that increase the chance of getting cervical cancer. In these cases, your health care provider may recommend more frequent screening and Pap tests. Colorectal Cancer  This type of cancer can be detected and often prevented.  Routine colorectal cancer screening usually begins at 44 years of age and continues through 44 years of age.  Your health care provider may recommend screening at an earlier age if you have risk factors for colon cancer.  Your health care provider may also recommend using home test kits to check for hidden blood in the stool.  A small camera at the end of a tube can be used to examine your colon directly (sigmoidoscopy or colonoscopy). This is done to check for the earliest forms of colorectal cancer.  Routine screening usually begins at age 14.  Direct examination of the colon should be repeated every 5-10 years through 44 years of age. However, you may need to be screened more often if early forms of precancerous polyps or small growths are found. Skin Cancer  Check your skin from head to  toe regularly.  Tell your health care provider about any new moles or changes in moles, especially if there is a change in a mole's shape or color.  Also tell your health care provider if you have a mole that is larger than the size of a pencil eraser.  Always use sunscreen. Apply sunscreen liberally and repeatedly throughout the day.  Protect yourself by wearing long sleeves, pants, a wide-brimmed hat, and sunglasses whenever you are outside. Heart disease, diabetes, and high blood pressure  High blood pressure causes heart disease and increases the risk of stroke. High blood pressure is more likely to develop in:  People who have blood pressure in the high end of the normal range (130-139/85-89 mm Hg).  People who are overweight or obese.  People who are African American.  If you are 40-59 years of age, have your blood pressure checked every  3-5 years. If you are 41 years of age or older, have your blood pressure checked every year. You should have your blood pressure measured twice-once when you are at a hospital or clinic, and once when you are not at a hospital or clinic. Record the average of the two measurements. To check your blood pressure when you are not at a hospital or clinic, you can use:  An automated blood pressure machine at a pharmacy.  A home blood pressure monitor.  If you are between 18 years and 43 years old, ask your health care provider if you should take aspirin to prevent strokes.  Have regular diabetes screenings. This involves taking a blood sample to check your fasting blood sugar level.  If you are at a normal weight and have a low risk for diabetes, have this test once every three years after 44 years of age.  If you are overweight and have a high risk for diabetes, consider being tested at a younger age or more often. Preventing infection Hepatitis B  If you have a higher risk for hepatitis B, you should be screened for this virus. You are considered at high risk for hepatitis B if:  You were born in a country where hepatitis B is common. Ask your health care provider which countries are considered high risk.  Your parents were born in a high-risk country, and you have not been immunized against hepatitis B (hepatitis B vaccine).  You have HIV or AIDS.  You use needles to inject street drugs.  You live with someone who has hepatitis B.  You have had sex with someone who has hepatitis B.  You get hemodialysis treatment.  You take certain medicines for conditions, including cancer, organ transplantation, and autoimmune conditions. Hepatitis C  Blood testing is recommended for:  Everyone born from [birth year] through 1964.  Anyone with known risk factors for hepatitis C. Sexually transmitted infections (STIs)  You should be screened for sexually transmitted infections (STIs) including  gonorrhea and chlamydia if:  You are sexually active and are younger than 44 years of age.  You are older than 44 years of age and your health care provider tells you that you are at risk for this type of infection.  Your sexual activity has changed since you were last screened and you are at an increased risk for chlamydia or gonorrhea. Ask your health care provider if you are at risk.  If you do not have HIV, but are at risk, it may be recommended that you take a prescription medicine daily to prevent HIV infection. This is called  pre-exposure prophylaxis (PrEP). You are considered at risk if:  You are sexually active and do not regularly use condoms or know the HIV status of your partner(s).  You take drugs by injection.  You are sexually active with a partner who has HIV. Talk with your health care provider about whether you are at high risk of being infected with HIV. If you choose to begin PrEP, you should first be tested for HIV. You should then be tested every 3 months for as long as you are taking PrEP. Pregnancy  If you are premenopausal and you may become pregnant, ask your health care provider about preconception counseling.  If you may become pregnant, take 400 to 800 micrograms (mcg) of folic acid every day.  If you want to prevent pregnancy, talk to your health care provider about birth control (contraception). Osteoporosis and menopause  Osteoporosis is a disease in which the bones lose minerals and strength with aging. This can result in serious bone fractures. Your risk for osteoporosis can be identified using a bone density scan.  If you are 50 years of age or older, or if you are at risk for osteoporosis and fractures, ask your health care provider if you should be screened.  Ask your health care provider whether you should take a calcium or vitamin D supplement to lower your risk for osteoporosis.  Menopause may have certain physical symptoms and risks.  Hormone  replacement therapy may reduce some of these symptoms and risks. Talk to your health care provider about whether hormone replacement therapy is right for you. Follow these instructions at home:  Schedule regular health, dental, and eye exams.  Stay current with your immunizations.  Do not use any tobacco products including cigarettes, chewing tobacco, or electronic cigarettes.  If you are pregnant, do not drink alcohol.  If you are breastfeeding, limit how much and how often you drink alcohol.  Limit alcohol intake to no more than 1 drink per day for nonpregnant women. One drink equals 12 ounces of beer, 5 ounces of wine, or 1 ounces of hard liquor.  Do not use street drugs.  Do not share needles.  Ask your health care provider for help if you need support or information about quitting drugs.  Tell your health care provider if you often feel depressed.  Tell your health care provider if you have ever been abused or do not feel safe at home. This information is not intended to replace advice given to you by your health care provider. Make sure you discuss any questions you have with your health care provider. Document Released: 02/22/2011 Document Revised: 01/15/2016 Document Reviewed: 05/13/2015  2017 [employer]vier

## 2016-08-11 ENCOUNTER — Ambulatory Visit (INDEPENDENT_AMBULATORY_CARE_PROVIDER_SITE_OTHER): Payer: BLUE CROSS/BLUE SHIELD | Admitting: Internal Medicine

## 2016-08-11 ENCOUNTER — Other Ambulatory Visit (INDEPENDENT_AMBULATORY_CARE_PROVIDER_SITE_OTHER): Payer: BLUE CROSS/BLUE SHIELD

## 2016-08-11 ENCOUNTER — Encounter: Payer: Self-pay | Admitting: Internal Medicine

## 2016-08-11 VITALS — BP 124/70 | HR 80 | Temp 98.6°F | Resp 16 | Ht 64.0 in | Wt 177.0 lb

## 2016-08-11 DIAGNOSIS — D649 Anemia, unspecified: Secondary | ICD-10-CM

## 2016-08-11 DIAGNOSIS — F419 Anxiety disorder, unspecified: Secondary | ICD-10-CM | POA: Diagnosis not present

## 2016-08-11 DIAGNOSIS — Z0000 Encounter for general adult medical examination without abnormal findings: Secondary | ICD-10-CM | POA: Diagnosis not present

## 2016-08-11 DIAGNOSIS — K219 Gastro-esophageal reflux disease without esophagitis: Secondary | ICD-10-CM | POA: Diagnosis not present

## 2016-08-11 DIAGNOSIS — R109 Unspecified abdominal pain: Secondary | ICD-10-CM

## 2016-08-11 LAB — COMPREHENSIVE METABOLIC PANEL
ALK PHOS: 38 U/L — AB (ref 39–117)
ALT: 12 U/L (ref 0–35)
AST: 16 U/L (ref 0–37)
Albumin: 4 g/dL (ref 3.5–5.2)
BUN: 9 mg/dL (ref 6–23)
CO2: 26 mEq/L (ref 19–32)
Calcium: 9 mg/dL (ref 8.4–10.5)
Chloride: 105 mEq/L (ref 96–112)
Creatinine, Ser: 0.66 mg/dL (ref 0.40–1.20)
GFR: 103.17 mL/min (ref >60.00)
GLUCOSE: 108 mg/dL — AB (ref 70–99)
POTASSIUM: 4.2 meq/L (ref 3.5–5.1)
SODIUM: 138 meq/L (ref 135–145)
TOTAL PROTEIN: 7.4 g/dL (ref 6.0–8.3)
Total Bilirubin: 0.3 mg/dL (ref 0.2–1.2)

## 2016-08-11 LAB — LIPID PANEL
Cholesterol: 210 mg/dL — ABNORMAL HIGH (ref 0–200)
HDL: 49.5 mg/dL (ref >39.00)
LDL CALC: 122 mg/dL — AB (ref 0–99)
NONHDL: 160.51
Total CHOL/HDL Ratio: 4
Triglycerides: 193 mg/dL — ABNORMAL HIGH (ref 0.0–149.0)
VLDL: 38.6 mg/dL (ref 0.0–40.0)

## 2016-08-11 LAB — CBC WITH DIFFERENTIAL/PLATELET
BASOS PCT: 0.5 % (ref 0.0–3.0)
Basophils Absolute: 0 10*3/uL (ref 0.0–0.1)
EOS PCT: 1 % (ref 0.0–5.0)
Eosinophils Absolute: 0.1 10*3/uL (ref 0.0–0.7)
HEMATOCRIT: 39.3 % (ref 36.0–46.0)
HEMOGLOBIN: 13.5 g/dL (ref 12.0–15.0)
LYMPHS PCT: 22.9 % (ref 12.0–46.0)
Lymphs Abs: 1.5 10*3/uL (ref 0.7–4.0)
MCHC: 34.4 g/dL (ref 30.0–36.0)
MCV: 87.8 fl (ref 78.0–100.0)
MONO ABS: 0.2 10*3/uL (ref 0.1–1.0)
MONOS PCT: 3.5 % (ref 3.0–12.0)
Neutro Abs: 4.7 10*3/uL (ref 1.4–7.7)
Neutrophils Relative %: 72.1 % (ref 43.0–77.0)
Platelets: 324 10*3/uL (ref 150.0–400.0)
RBC: 4.48 Mil/uL (ref 3.87–5.11)
RDW: 13.1 % (ref 11.5–15.5)
WBC: 6.5 10*3/uL (ref 4.0–10.5)

## 2016-08-11 LAB — HEMOGLOBIN A1C: HEMOGLOBIN A1C: 5.6 % (ref 4.6–6.5)

## 2016-08-11 LAB — TSH: TSH: 1.68 u[IU]/mL (ref 0.35–4.50)

## 2016-08-11 LAB — IRON: Iron: 91 ug/dL (ref 42–145)

## 2016-08-11 LAB — FERRITIN: Ferritin: 10.6 ng/mL (ref 10.0–291.0)

## 2016-08-11 MED ORDER — RANITIDINE HCL 150 MG PO TABS: 150.0000 mg | ORAL_TABLET | Freq: Every day | ORAL | Status: DC

## 2016-08-11 NOTE — Assessment & Plan Note (Signed)
Pain on left side - bottom of rib cage - ? LUQ pain Intermittent ? Muscular or GI related No clear pattern She thinks it may be muscular from a work out - will not be working out over the holidays so hopefully will resolve Zantac nightly for GERD Revise diet as needed Return if no improvement/resolution

## 2016-08-11 NOTE — Assessment & Plan Note (Signed)
Following with gyn - had thickened endometrium - s/p biopsy which was normal On OCPs - but concerned regarding risk, not sure if she wants to have the ablation, was anemic w/o treatment Will meet with gyn to discuss options Check cbc, iron/ferritin

## 2016-08-11 NOTE — Assessment & Plan Note (Signed)
Having more frequent GERD  Taking omeprazole almost daily Switch to zantac nightly

## 2016-08-11 NOTE — Progress Notes (Signed)
Pre visit review using our clinic review tool, if applicable. No additional management support is needed unless otherwise documented below in the visit note. 

## 2016-08-11 NOTE — Assessment & Plan Note (Signed)
Has occasional anxiety Uses xanax on very rare occasion for situation  Understands risk of addiction Can continue as needed

## 2016-08-12 ENCOUNTER — Encounter: Payer: Self-pay | Admitting: Internal Medicine

## 2016-09-10 DIAGNOSIS — F4323 Adjustment disorder with mixed anxiety and depressed mood: Secondary | ICD-10-CM | POA: Diagnosis not present

## 2016-09-24 DIAGNOSIS — F4323 Adjustment disorder with mixed anxiety and depressed mood: Secondary | ICD-10-CM | POA: Diagnosis not present

## 2016-10-05 ENCOUNTER — Telehealth: Payer: BLUE CROSS/BLUE SHIELD | Admitting: Family

## 2016-10-05 ENCOUNTER — Encounter: Payer: Self-pay | Admitting: Internal Medicine

## 2016-10-05 DIAGNOSIS — H1011 Acute atopic conjunctivitis, right eye: Secondary | ICD-10-CM

## 2016-10-05 MED ORDER — OFLOXACIN 0.3 % OP SOLN: 1.0000 [drp] | Freq: Four times a day (QID) | OPHTHALMIC | 0 refills | Status: DC

## 2016-10-05 MED ORDER — ERYTHROMYCIN 5 MG/GM OP OINT: 1.0000 "application " | TOPICAL_OINTMENT | Freq: Four times a day (QID) | OPHTHALMIC | 0 refills | Status: DC

## 2016-10-05 NOTE — Progress Notes (Signed)
We are sorry that you are not feeling well.  Here is how we plan to help!  Based on what you have shared with me it looks like you have conjunctivitis.  Conjunctivitis is a common inflammatory or infectious condition of the eye that is often referred to as "pink eye".  In most cases it is contagious (viral or bacterial). However, not all conjunctivitis requires antibiotics (ex. Allergic).  We have made appropriate suggestions for you based upon your presentation.  I have prescribed Oflaxacin 1-2 drops 4 times a day times 5 days . Take an antihistamine like claritin as well. This is likely allergic but I have sent in the antibiotic eye drop as well.  Pink eye can be highly contagious.  It is typically spread through direct contact with secretions, or contaminated objects or surfaces that one may have touched.  Strict handwashing is suggested with soap and water is urged.  If not available, use alcohol based had sanitizer.  Avoid unnecessary touching of the eye.  If you wear contact lenses, you will need to refrain from wearing them until you see no white discharge from the eye for at least 24 hours after being on medication.  You should see symptom improvement in 1-2 days after starting the medication regimen.  Call us if symptoms are not improved in 1-2 days.  Home Care:  Wash your hands often!  Do not wear your contacts until you complete your treatment plan.  Avoid sharing towels, bed linen, personal items with a person who has pink eye.  See attention for anyone in your home with similar symptoms.  Get Help Right Away If:  Your symptoms do not improve.  You develop blurred or loss of vision.  Your symptoms worsen (increased discharge, pain or redness)  Your e-visit answers were reviewed by a board certified advanced clinical practitioner to complete your personal care plan.  Depending on the condition, your plan could have included both over the counter or prescription medications.  If  there is a problem please reply  once you have received a response from your provider.  Your safety is important to [place].  If you have drug allergies check your prescription carefully.    You can use MyChart to ask questions about today's visit, request a non-urgent call back, or ask for a work or school excuse for 24 hours related to this e-Visit. If it has been greater than 24 hours you will need to follow up with your provider, or enter a new e-Visit to address those concerns.   You will get an e-mail in the next two days asking about your experience.  I hope that your e-visit has been valuable and will speed your recovery. Thank you for using e-visits.

## 2016-10-07 ENCOUNTER — Ambulatory Visit: Payer: Self-pay | Admitting: Psychology

## 2016-10-08 ENCOUNTER — Encounter: Payer: Self-pay | Admitting: Adult Health

## 2016-10-08 ENCOUNTER — Ambulatory Visit (INDEPENDENT_AMBULATORY_CARE_PROVIDER_SITE_OTHER): Payer: BLUE CROSS/BLUE SHIELD | Admitting: Adult Health

## 2016-10-08 VITALS — BP 130/74 | Temp 98.1°F | Ht 64.0 in | Wt 179.6 lb

## 2016-10-08 DIAGNOSIS — R6889 Other general symptoms and signs: Secondary | ICD-10-CM

## 2016-10-08 DIAGNOSIS — J029 Acute pharyngitis, unspecified: Secondary | ICD-10-CM | POA: Diagnosis not present

## 2016-10-08 LAB — POCT RAPID STREP A (OFFICE): RAPID STREP A SCREEN: NEGATIVE

## 2016-10-08 LAB — POCT INFLUENZA A/B
INFLUENZA B, POC: NEGATIVE
Influenza A, POC: NEGATIVE

## 2016-10-08 MED ORDER — OSELTAMIVIR PHOSPHATE 75 MG PO CAPS: 75.0000 mg | ORAL_CAPSULE | Freq: Every day | ORAL | 0 refills | Status: DC

## 2016-10-08 MED ORDER — MAGIC MOUTHWASH W/LIDOCAINE: 5.0000 mL | Freq: Three times a day (TID) | ORAL | 0 refills | Status: DC | PRN

## 2016-10-08 NOTE — Progress Notes (Signed)
Subjective:    Patient ID: Andrea Wells, female    DOB: 09-29-1970, 46 y.o.   MRN: [id number]  HPI  Flu like symptoms for 24 hours. Her symptoms include that of fatigue, rhinorrhea, mild cough, mild sore throat, and mild headache.   She denies fevers, n/v/d, or trouble swallowing.   Her son was diagnosed with flu A and strep throat yesterday.   She is leaving for [facility] tomorrow for 4 day trip.   Review of Systems See HPI  Past Medical History:  Diagnosis Date  . GERD (gastroesophageal reflux disease)   . Postpartum depression    prev on sertraline for same    Social History   Social History  . Marital status: Married    Spouse name: N/A  . Number of children: 2  . Years of education: N/A   Occupational History  . Not on file.   Social History Main Topics  . Smoking status: Never Smoker  . Smokeless tobacco: Never Used  . Alcohol use Yes     Comment: occasional  . Drug use: No  . Sexual activity: Not on file   Other Topics Concern  . 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 CESAREAN SECTION  02/2008 & 09/2005   x 2    Family History  Problem Relation Age of Onset  . Crohn's disease Mother     ? dx, improved w/ gluten free diet  . Prostate cancer Father 47  . Colon cancer Father 39  . Hypertension Father   . Alcohol abuse Other   . Diabetes Paternal Aunt   . Breast cancer Paternal Aunt 61  . Breast cancer Paternal Grandmother   . Diabetes Paternal Grandmother   . Heart Problems Maternal Grandfather 50    complications of valve repair - died intraop  . Heart Problems Maternal Uncle     open heart valve repair, died year after    Allergies  Allergen Reactions  . Penicillins Hives  . Sulfonamide Derivatives Hives    Current Outpatient  Prescriptions on File Prior to Visit  Medication Sig Dispense Refill  . ALPRAZolam (XANAX) 0.5 MG tablet Take 1 tablet (0.5 mg total) by mouth 2 (two) times daily as needed for sleep or anxiety. 30 tablet 0  . erythromycin ophthalmic ointment Place 1 application into the right eye 4 (four) times daily. 3.5 g 0  . JUNEL FE 1/20 1-20 MG-MCG tablet   0  . Multiple Vitamin (MULTIVITAMIN) tablet Take 1 tablet by mouth daily. Reported on 08/12/2015    . ofloxacin (OCUFLOX) 0.3 % ophthalmic solution Place 1 drop into the left eye 4 (four) times daily. 5 mL 0  . Omega-3 Fatty Acids (FISH OIL PO) Take by mouth. Reported on 08/12/2015    . ranitidine (ZANTAC) 150 MG tablet Take 1 tablet (150 mg total) by mouth at bedtime.     No current facility-administered medications on file prior to visit.     BP 130/74   Temp 98.1 F (36.7 C) (Oral)   Ht 5\' 4"  (1.626 m)   Wt 179 lb 9.6 oz (81.5 kg)   BMI 30.83 kg/m       Objective:   Physical Exam  Constitutional: She is oriented to  person, place, and time. She appears well-developed and well-nourished. No distress.  HENT:  Head: Normocephalic and atraumatic.  Right Ear: Hearing, tympanic membrane, external ear and ear canal normal.  Left Ear: Hearing, tympanic membrane, external ear and ear canal normal.  Nose: Nose normal. No mucosal edema or rhinorrhea. Right sinus exhibits no maxillary sinus tenderness and no frontal sinus tenderness. Left sinus exhibits no maxillary sinus tenderness and no frontal sinus tenderness.  Mouth/Throat: Uvula is midline, oropharynx is clear and moist and mucous membranes are normal. No oropharyngeal exudate.  Eyes: Conjunctivae and EOM are normal. Pupils are equal, round, and reactive to light. Right eye exhibits no discharge. No scleral icterus.  Cardiovascular: Normal rate, regular rhythm, normal heart sounds and intact distal pulses.  Exam reveals no gallop and no friction rub.   No murmur heard. Pulmonary/Chest:  Effort normal and breath sounds normal. No respiratory distress. She has no wheezes. She has no rales. She exhibits no tenderness.  Neurological: She is alert and oriented to person, place, and time.  Skin: Skin is warm and dry. No rash noted. She is not diaphoretic. No erythema. No pallor.  Psychiatric: She has a normal mood and affect. Her behavior is normal. Judgment and thought content normal.  Nursing note and vitals reviewed.     Assessment & Plan:  1. Sore throat  - POC Rapid Strep A- negative  - magic mouthwash w/lidocaine SOLN; Take 5 mLs by mouth 3 (three) times daily as needed for mouth pain. Swish and spit  Dispense: 180 mL; Refill: 0 - Tylenol/Motrin - Stay hydrated   2. Flu-like symptoms - POC Influenza A/B- negative. Will treat with tamiflu since her son tested positive - oseltamivir (TAMIFLU) 75 MG capsule; Take 1 capsule (75 mg total) by mouth daily.  Dispense: 10 capsule; Refill: 0  Dorothyann Peng, NP

## 2016-10-22 ENCOUNTER — Ambulatory Visit (INDEPENDENT_AMBULATORY_CARE_PROVIDER_SITE_OTHER): Payer: BLUE CROSS/BLUE SHIELD | Admitting: Psychology

## 2016-10-22 DIAGNOSIS — F411 Generalized anxiety disorder: Secondary | ICD-10-CM

## 2016-11-05 ENCOUNTER — Ambulatory Visit (INDEPENDENT_AMBULATORY_CARE_PROVIDER_SITE_OTHER): Payer: BLUE CROSS/BLUE SHIELD | Admitting: Psychology

## 2016-11-05 DIAGNOSIS — F4321 Adjustment disorder with depressed mood: Secondary | ICD-10-CM | POA: Diagnosis not present

## 2016-11-26 ENCOUNTER — Ambulatory Visit (INDEPENDENT_AMBULATORY_CARE_PROVIDER_SITE_OTHER): Payer: BLUE CROSS/BLUE SHIELD | Admitting: Psychology

## 2016-11-26 DIAGNOSIS — F4321 Adjustment disorder with depressed mood: Secondary | ICD-10-CM

## 2016-12-24 ENCOUNTER — Ambulatory Visit (INDEPENDENT_AMBULATORY_CARE_PROVIDER_SITE_OTHER): Payer: BLUE CROSS/BLUE SHIELD | Admitting: Psychology

## 2016-12-24 DIAGNOSIS — F4321 Adjustment disorder with depressed mood: Secondary | ICD-10-CM

## 2016-12-27 DIAGNOSIS — L814 Other melanin hyperpigmentation: Secondary | ICD-10-CM | POA: Diagnosis not present

## 2016-12-27 DIAGNOSIS — L821 Other seborrheic keratosis: Secondary | ICD-10-CM | POA: Diagnosis not present

## 2016-12-27 DIAGNOSIS — D1801 Hemangioma of skin and subcutaneous tissue: Secondary | ICD-10-CM | POA: Diagnosis not present

## 2016-12-27 DIAGNOSIS — L820 Inflamed seborrheic keratosis: Secondary | ICD-10-CM | POA: Diagnosis not present

## 2016-12-27 DIAGNOSIS — L700 Acne vulgaris: Secondary | ICD-10-CM | POA: Diagnosis not present

## 2017-01-13 DIAGNOSIS — Z800 Family history of malignant neoplasm of digestive organs: Secondary | ICD-10-CM | POA: Diagnosis not present

## 2017-01-13 DIAGNOSIS — Z8042 Family history of malignant neoplasm of prostate: Secondary | ICD-10-CM | POA: Diagnosis not present

## 2017-01-13 DIAGNOSIS — Z803 Family history of malignant neoplasm of breast: Secondary | ICD-10-CM | POA: Diagnosis not present

## 2017-01-13 DIAGNOSIS — N644 Mastodynia: Secondary | ICD-10-CM | POA: Diagnosis not present

## 2017-01-24 ENCOUNTER — Ambulatory Visit (INDEPENDENT_AMBULATORY_CARE_PROVIDER_SITE_OTHER): Payer: BLUE CROSS/BLUE SHIELD | Admitting: Psychology

## 2017-01-24 DIAGNOSIS — F4321 Adjustment disorder with depressed mood: Secondary | ICD-10-CM

## 2017-02-22 ENCOUNTER — Ambulatory Visit (INDEPENDENT_AMBULATORY_CARE_PROVIDER_SITE_OTHER): Payer: BLUE CROSS/BLUE SHIELD | Admitting: Psychology

## 2017-02-22 DIAGNOSIS — F4321 Adjustment disorder with depressed mood: Secondary | ICD-10-CM

## 2017-02-28 DIAGNOSIS — Z130 Encounter for screening for diseases of the blood and blood-forming organs and certain disorders involving the immune mechanism: Secondary | ICD-10-CM | POA: Diagnosis not present

## 2017-02-28 DIAGNOSIS — Z1151 Encounter for screening for human papillomavirus (HPV): Secondary | ICD-10-CM | POA: Diagnosis not present

## 2017-02-28 DIAGNOSIS — Z1231 Encounter for screening mammogram for malignant neoplasm of breast: Secondary | ICD-10-CM | POA: Diagnosis not present

## 2017-02-28 DIAGNOSIS — Z124 Encounter for screening for malignant neoplasm of cervix: Secondary | ICD-10-CM | POA: Diagnosis not present

## 2017-02-28 DIAGNOSIS — Z1389 Encounter for screening for other disorder: Secondary | ICD-10-CM | POA: Diagnosis not present

## 2017-02-28 DIAGNOSIS — N920 Excessive and frequent menstruation with regular cycle: Secondary | ICD-10-CM | POA: Diagnosis not present

## 2017-02-28 DIAGNOSIS — Z01419 Encounter for gynecological examination (general) (routine) without abnormal findings: Secondary | ICD-10-CM | POA: Diagnosis not present

## 2017-02-28 DIAGNOSIS — Z6830 Body mass index (BMI) 30.0-30.9, adult: Secondary | ICD-10-CM | POA: Diagnosis not present

## 2017-02-28 LAB — HM PAP SMEAR: HM PAP: NEGATIVE

## 2017-02-28 LAB — HM MAMMOGRAPHY

## 2017-03-03 ENCOUNTER — Encounter: Payer: Self-pay | Admitting: Internal Medicine

## 2017-03-03 NOTE — Progress Notes (Signed)
Abstracted and sent to scan  

## 2017-04-13 ENCOUNTER — Ambulatory Visit (INDEPENDENT_AMBULATORY_CARE_PROVIDER_SITE_OTHER): Payer: BLUE CROSS/BLUE SHIELD | Admitting: Psychology

## 2017-04-13 DIAGNOSIS — F4321 Adjustment disorder with depressed mood: Secondary | ICD-10-CM | POA: Diagnosis not present

## 2017-05-16 ENCOUNTER — Ambulatory Visit: Payer: Self-pay | Admitting: Psychology

## 2017-06-01 DIAGNOSIS — N920 Excessive and frequent menstruation with regular cycle: Secondary | ICD-10-CM | POA: Diagnosis not present

## 2017-10-03 DIAGNOSIS — Z30430 Encounter for insertion of intrauterine contraceptive device: Secondary | ICD-10-CM | POA: Diagnosis not present

## 2017-10-04 ENCOUNTER — Encounter: Payer: Self-pay | Admitting: Internal Medicine

## 2017-10-11 NOTE — Patient Instructions (Addendum)
Get 500-600 mg of calcium a day in a supplement and [phone] units of vitamin D.   Test(s) ordered today. Your results will be released to [place] (or called to you) after review, usually within 72hours after test completion. If any changes need to be made, you will be notified at that same time.  All other Health Maintenance issues reviewed.   All recommended immunizations and age-appropriate screenings are up-to-date or discussed.  No immunizations administered today.   Medications reviewed and updated.  No changes recommended at this time.  Please followup in one year   Health Maintenance, Female Adopting a healthy lifestyle and getting preventive care can go a long way to promote health and wellness. Talk with your health care provider about what schedule of regular examinations is right for you. This is a good chance for you to check in with your provider about disease prevention and staying healthy. In between checkups, there are plenty of things you can do on your own. Experts have done a lot of research about which lifestyle changes and preventive measures are most likely to keep you healthy. Ask your health care provider for more information. Weight and diet Eat a healthy diet  Be sure to include plenty of vegetables, fruits, low-fat dairy products, and lean protein.  Do not eat a lot of foods high in solid fats, added sugars, or salt.  Get regular exercise. This is one of the most important things you can do for your health. ? Most adults should exercise for at least 150 minutes each week. The exercise should increase your heart rate and make you sweat (moderate-intensity exercise). ? Most adults should also do strengthening exercises at least twice a week. This is in addition to the moderate-intensity exercise.  Maintain a healthy weight  Body mass index (BMI) is a measurement that can be used to identify possible weight problems. It estimates body fat based on height and  weight. Your health care provider can help determine your BMI and help you achieve or maintain a healthy weight.  For females 62 years of age and older: ? A BMI below 18.5 is considered underweight. ? A BMI of 18.5 to 24.9 is normal. ? A BMI of 25 to 29.9 is considered overweight. ? A BMI of 30 and above is considered obese.  Watch levels of cholesterol and blood lipids  You should start having your blood tested for lipids and cholesterol at 46 years of age, then have this test every 5 years.  You may need to have your cholesterol levels checked more often if: ? Your lipid or cholesterol levels are high. ? You are older than 46 years of age. ? You are at high risk for heart disease.  Cancer screening Lung Cancer  Lung cancer screening is recommended for adults 58-96 years old who are at high risk for lung cancer because of a history of smoking.  A yearly low-dose CT scan of the lungs is recommended for people who: ? Currently smoke. ? Have quit within the past 15 years. ? Have at least a 30-pack-year history of smoking. A pack year is smoking an average of one pack of cigarettes a day for 1 year.  Yearly screening should continue until it has been 15 years since you quit.  Yearly screening should stop if you develop a health problem that would prevent you from having lung cancer treatment.  Breast Cancer  Practice breast self-awareness. This means understanding how your breasts normally appear and feel.  It also means doing regular breast self-exams. Let your health care provider know about any changes, no matter how small.  If you are in your 20s or 30s, you should have a clinical breast exam (CBE) by a health care provider every 1-3 years as part of a regular health exam.  If you are 5 or older, have a CBE every year. Also consider having a breast X-ray (mammogram) every year.  If you have a family history of breast cancer, talk to your health care provider about genetic  screening.  If you are at high risk for breast cancer, talk to your health care provider about having an MRI and a mammogram every year.  Breast cancer gene (BRCA) assessment is recommended for women who have family members with BRCA-related cancers. BRCA-related cancers include: ? Breast. ? Ovarian. ? Tubal. ? Peritoneal cancers.  Results of the assessment will determine the need for genetic counseling and BRCA1 and BRCA2 testing.  Cervical Cancer Your health care provider may recommend that you be screened regularly for cancer of the pelvic organs (ovaries, uterus, and vagina). This screening involves a pelvic examination, including checking for microscopic changes to the surface of your cervix (Pap test). You may be encouraged to have this screening done every 3 years, beginning at age 28.  For women ages 50-65, health care providers may recommend pelvic exams and Pap testing every 3 years, or they may recommend the Pap and pelvic exam, combined with testing for human papilloma virus (HPV), every 5 years. Some types of HPV increase your risk of cervical cancer. Testing for HPV may also be done on women of any age with unclear Pap test results.  Other health care providers may not recommend any screening for nonpregnant women who are considered low risk for pelvic cancer and who do not have symptoms. Ask your health care provider if a screening pelvic exam is right for you.  If you have had past treatment for cervical cancer or a condition that could lead to cancer, you need Pap tests and screening for cancer for at least 20 years after your treatment. If Pap tests have been discontinued, your risk factors (such as having a new sexual partner) need to be reassessed to determine if screening should resume. Some women have medical problems that increase the chance of getting cervical cancer. In these cases, your health care provider may recommend more frequent screening and Pap  tests.  Colorectal Cancer  This type of cancer can be detected and often prevented.  Routine colorectal cancer screening usually begins at 46 years of age and continues through 46 years of age.  Your health care provider may recommend screening at an earlier age if you have risk factors for colon cancer.  Your health care provider may also recommend using home test kits to check for hidden blood in the stool.  A small camera at the end of a tube can be used to examine your colon directly (sigmoidoscopy or colonoscopy). This is done to check for the earliest forms of colorectal cancer.  Routine screening usually begins at age 59.  Direct examination of the colon should be repeated every 5-10 years through 46 years of age. However, you may need to be screened more often if early forms of precancerous polyps or small growths are found.  Skin Cancer  Check your skin from head to toe regularly.  Tell your health care provider about any new moles or changes in moles, especially if there is a  change in a mole's shape or color.  Also tell your health care provider if you have a mole that is larger than the size of a pencil eraser.  Always use sunscreen. Apply sunscreen liberally and repeatedly throughout the day.  Protect yourself by wearing long sleeves, pants, a wide-brimmed hat, and sunglasses whenever you are outside.  Heart disease, diabetes, and high blood pressure  High blood pressure causes heart disease and increases the risk of stroke. High blood pressure is more likely to develop in: ? People who have blood pressure in the high end of the normal range (130-139/85-89 mm Hg). ? People who are overweight or obese. ? People who are African American.  If you are 18-39 years of age, have your blood pressure checked every 3-5 years. If you are 40 years of age or older, have your blood pressure checked every year. You should have your blood pressure measured twice-once when you are at  a hospital or clinic, and once when you are not at a hospital or clinic. Record the average of the two measurements. To check your blood pressure when you are not at a hospital or clinic, you can use: ? An automated blood pressure machine at a pharmacy. ? A home blood pressure monitor.  If you are between 55 years and 79 years old, ask your health care provider if you should take aspirin to prevent strokes.  Have regular diabetes screenings. This involves taking a blood sample to check your fasting blood sugar level. ? If you are at a normal weight and have a low risk for diabetes, have this test once every three years after 45 years of age. ? If you are overweight and have a high risk for diabetes, consider being tested at a younger age or more often. Preventing infection Hepatitis B  If you have a higher risk for hepatitis B, you should be screened for this virus. You are considered at high risk for hepatitis B if: ? You were born in a country where hepatitis B is common. Ask your health care provider which countries are considered high risk. ? Your parents were born in a high-risk country, and you have not been immunized against hepatitis B (hepatitis B vaccine). ? You have HIV or AIDS. ? You use needles to inject street drugs. ? You live with someone who has hepatitis B. ? You have had sex with someone who has hepatitis B. ? You get hemodialysis treatment. ? You take certain medicines for conditions, including cancer, organ transplantation, and autoimmune conditions.  Hepatitis C  Blood testing is recommended for: ? Everyone born from 1945 through 1965. ? Anyone with known risk factors for hepatitis C.  Sexually transmitted infections (STIs)  You should be screened for sexually transmitted infections (STIs) including gonorrhea and chlamydia if: ? You are sexually active and are younger than 46 years of age. ? You are older than 46 years of age and your health care provider tells  you that you are at risk for this type of infection. ? Your sexual activity has changed since you were last screened and you are at an increased risk for chlamydia or gonorrhea. Ask your health care provider if you are at risk.  If you do not have HIV, but are at risk, it may be recommended that you take a prescription medicine daily to prevent HIV infection. This is called pre-exposure prophylaxis (PrEP). You are considered at risk if: ? You are sexually active and do not regularly   use condoms or know the HIV status of your partner(s). ? You take drugs by injection. ? You are sexually active with a partner who has HIV.  Talk with your health care provider about whether you are at high risk of being infected with HIV. If you choose to begin PrEP, you should first be tested for HIV. You should then be tested every 3 months for as long as you are taking PrEP. Pregnancy  If you are premenopausal and you may become pregnant, ask your health care provider about preconception counseling.  If you may become pregnant, take 400 to 800 micrograms (mcg) of folic acid every day.  If you want to prevent pregnancy, talk to your health care provider about birth control (contraception). Osteoporosis and menopause  Osteoporosis is a disease in which the bones lose minerals and strength with aging. This can result in serious bone fractures. Your risk for osteoporosis can be identified using a bone density scan.  If you are 65 years of age or older, or if you are at risk for osteoporosis and fractures, ask your health care provider if you should be screened.  Ask your health care provider whether you should take a calcium or vitamin D supplement to lower your risk for osteoporosis.  Menopause may have certain physical symptoms and risks.  Hormone replacement therapy may reduce some of these symptoms and risks. Talk to your health care provider about whether hormone replacement therapy is right for  you. Follow these instructions at home:  Schedule regular health, dental, and eye exams.  Stay current with your immunizations.  Do not use any tobacco products including cigarettes, chewing tobacco, or electronic cigarettes.  If you are pregnant, do not drink alcohol.  If you are breastfeeding, limit how much and how often you drink alcohol.  Limit alcohol intake to no more than 1 drink per day for nonpregnant women. One drink equals 12 ounces of beer, 5 ounces of wine, or 1 ounces of hard liquor.  Do not use street drugs.  Do not share needles.  Ask your health care provider for help if you need support or information about quitting drugs.  Tell your health care provider if you often feel depressed.  Tell your health care provider if you have ever been abused or do not feel safe at home. This information is not intended to replace advice given to you by your health care provider. Make sure you discuss any questions you have with your health care provider. Document Released: 02/22/2011 Document Revised: 01/15/2016 Document Reviewed: 05/13/2015 Elsevier Interactive Patient Education  2018 Elsevier Inc.  

## 2017-10-11 NOTE — Progress Notes (Signed)
Subjective:    Patient ID: Andrea Wells, female    DOB: [birth year], 47 y.o.   MRN: [id number]  HPI She is here for a physical exam.   She just had an IUD placed.  She is concerned about the hormones, but has very heavy periods without the hormones.  She was on oral birth control prior to the IUD.    She has no concerns.  Overall, she feels well and feels better than she has in a while.  She is exercising regularly.    Medications and allergies reviewed with patient and updated if appropriate.  Patient Active Problem List   Diagnosis Date Noted  . Menorrhagia 10/12/2017  . GERD (gastroesophageal reflux disease) 08/10/2016  . Family history of malignant neoplasm of gastrointestinal tract 01/25/2014  . Anemia 01/25/2014  . Anxiety 06/04/2013    Current Outpatient Medications on File Prior to Visit  Medication Sig Dispense Refill  . Multiple Vitamin (MULTIVITAMIN) tablet Take 1 tablet by mouth daily. Reported on 08/12/2015    . ranitidine (ZANTAC) 150 MG tablet Take 1 tablet (150 mg total) by mouth at bedtime.     No current facility-administered medications on file prior to visit.     Past Medical History:  Diagnosis Date  . GERD (gastroesophageal reflux disease)   . Postpartum depression    prev on sertraline for same    Past Surgical History:  Procedure Laterality Date  . CESAREAN SECTION  02/2008 & 09/2005   x 2    Social History   Socioeconomic History  . Marital status: Married    Spouse name: None  . Number of children: 2  . Years of education: None  . Highest education level: None  Social Needs  . Financial resource strain: None  . Food insecurity - worry: None  . Food insecurity - inability: None  . Transportation needs - medical: None  . Transportation needs - non-medical: None  Occupational History  . None  Tobacco Use  . Smoking status: Never Smoker  . Smokeless tobacco: Never Used  Substance and Sexual Activity  . Alcohol use: Yes   Comment: occasional  . Drug use: No  . Sexual activity: None  Other Topics Concern  . 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 Crohn's disease Mother        ? dx, improved w/ gluten free diet  . Prostate cancer Father 86  . Colon cancer Father 77  . Hypertension Father   . Alcohol abuse Other   . Diabetes Paternal Aunt   . Breast cancer Paternal Aunt 35  . Breast cancer Paternal Grandmother   . Diabetes Paternal Grandmother   . Heart Problems Maternal Grandfather 50       complications of valve repair - died intraop  . Heart Problems Maternal Uncle        open heart valve repair, died year after    Review of Systems  Constitutional: Negative for chills and fever.  HENT: Positive for postnasal drip.   Eyes: Negative for visual disturbance.  Respiratory: Positive for cough (mild). Negative for shortness of breath and wheezing.   Cardiovascular: Positive for palpitations (mild with coffee). Negative for chest pain and leg swelling.  Gastrointestinal: Positive for nausea (occ  in morning). Negative for abdominal pain, blood in stool, constipation and diarrhea.  Genitourinary: Negative for dysuria, frequency and hematuria.  Musculoskeletal: Negative for back pain and myalgias.  Skin: Positive for color change (intermittent on face - has derm appt). Negative for rash.  Neurological: Negative for dizziness, light-headedness and numbness.  Psychiatric/Behavioral: Negative for dysphoric mood. The patient is nervous/anxious (manageable - yoga/cycling, breathing - sees a therapist as needed).        Objective:   Vitals:   10/12/17 0901  BP: 112/76  Pulse: 60  Resp: 16  Temp: 98.1 F (36.7 C)  SpO2: 99%   Filed Weights   10/12/17 0901  Weight: 177 lb (80.3 kg)   Body mass index is  30.38 kg/m.  Wt Readings from Last 3 Encounters:  10/12/17 177 lb (80.3 kg)  10/08/16 179 lb 9.6 oz (81.5 kg)  08/11/16 177 lb (80.3 kg)     Physical Exam Constitutional: She appears well-developed and well-nourished. No distress.  HENT:  Head: Normocephalic and atraumatic.  Right Ear: External ear normal. Normal ear canal and TM Left Ear: External ear normal.  Normal ear canal and TM Mouth/Throat: Oropharynx is clear and moist.  Eyes: Conjunctivae and EOM are normal.  Neck: Neck supple. No tracheal deviation present. No thyromegaly present.  No carotid bruit  Cardiovascular: Normal rate, regular rhythm and normal heart sounds.   No murmur heard.  No edema. Pulmonary/Chest: Effort normal and breath sounds normal. No respiratory distress. She has no wheezes. She has no rales.  Breast: deferred to Gyn Abdominal: Soft. She exhibits no distension. There is no tenderness.  Lymphadenopathy: She has no cervical adenopathy.  Skin: Skin is warm and dry. She is not diaphoretic.  Psychiatric: She has a normal mood and affect. Her behavior is normal.        Assessment & Plan:   Physical exam: Screening blood work   ordered Immunizations  Up to date  Mammogram     Up to date  Gyn    Up to date  Eye exams   Up to date  Exercise  Doing spin and yoga regularly Weight   Has had difficulty losing weight, but feels good Skin    no concerns, sees derm Substance abuse   none  See Problem List for Assessment and Plan of chronic medical problems.  FU in one year

## 2017-10-12 ENCOUNTER — Encounter: Payer: Self-pay | Admitting: Internal Medicine

## 2017-10-12 ENCOUNTER — Ambulatory Visit (INDEPENDENT_AMBULATORY_CARE_PROVIDER_SITE_OTHER): Payer: BLUE CROSS/BLUE SHIELD | Admitting: Internal Medicine

## 2017-10-12 ENCOUNTER — Other Ambulatory Visit (INDEPENDENT_AMBULATORY_CARE_PROVIDER_SITE_OTHER): Payer: BLUE CROSS/BLUE SHIELD

## 2017-10-12 VITALS — BP 112/76 | HR 60 | Temp 98.1°F | Resp 16 | Ht 64.0 in | Wt 177.0 lb

## 2017-10-12 DIAGNOSIS — Z0000 Encounter for general adult medical examination without abnormal findings: Secondary | ICD-10-CM | POA: Diagnosis not present

## 2017-10-12 DIAGNOSIS — K219 Gastro-esophageal reflux disease without esophagitis: Secondary | ICD-10-CM | POA: Diagnosis not present

## 2017-10-12 DIAGNOSIS — E559 Vitamin D deficiency, unspecified: Secondary | ICD-10-CM

## 2017-10-12 DIAGNOSIS — E785 Hyperlipidemia, unspecified: Secondary | ICD-10-CM | POA: Diagnosis not present

## 2017-10-12 DIAGNOSIS — N922 Excessive menstruation at puberty: Secondary | ICD-10-CM

## 2017-10-12 DIAGNOSIS — F419 Anxiety disorder, unspecified: Secondary | ICD-10-CM | POA: Diagnosis not present

## 2017-10-12 DIAGNOSIS — N920 Excessive and frequent menstruation with regular cycle: Secondary | ICD-10-CM | POA: Insufficient documentation

## 2017-10-12 DIAGNOSIS — R739 Hyperglycemia, unspecified: Secondary | ICD-10-CM

## 2017-10-12 DIAGNOSIS — R7303 Prediabetes: Secondary | ICD-10-CM | POA: Insufficient documentation

## 2017-10-12 LAB — CBC WITH DIFFERENTIAL/PLATELET
BASOS ABS: 0.1 10*3/uL (ref 0.0–0.1)
Basophils Relative: 1.1 % (ref 0.0–3.0)
EOS ABS: 0.1 10*3/uL (ref 0.0–0.7)
Eosinophils Relative: 1.3 % (ref 0.0–5.0)
HCT: 41.3 % (ref 36.0–46.0)
Hemoglobin: 13.9 g/dL (ref 12.0–15.0)
LYMPHS ABS: 1.7 10*3/uL (ref 0.7–4.0)
Lymphocytes Relative: 24.8 % (ref 12.0–46.0)
MCHC: 33.6 g/dL (ref 30.0–36.0)
MCV: 90.4 fl (ref 78.0–100.0)
MONO ABS: 0.3 10*3/uL (ref 0.1–1.0)
MONOS PCT: 5.1 % (ref 3.0–12.0)
NEUTROS ABS: 4.6 10*3/uL (ref 1.4–7.7)
NEUTROS PCT: 67.7 % (ref 43.0–77.0)
PLATELETS: 365 10*3/uL (ref 150.0–400.0)
RBC: 4.56 Mil/uL (ref 3.87–5.11)
RDW: 12.7 % (ref 11.5–15.5)
WBC: 6.8 10*3/uL (ref 4.0–10.5)

## 2017-10-12 LAB — COMPREHENSIVE METABOLIC PANEL
ALK PHOS: 44 U/L (ref 39–117)
ALT: 10 U/L (ref 0–35)
AST: 14 U/L (ref 0–37)
Albumin: 4.1 g/dL (ref 3.5–5.2)
BILIRUBIN TOTAL: 0.4 mg/dL (ref 0.2–1.2)
BUN: 10 mg/dL (ref 6–23)
CO2: 25 mEq/L (ref 19–32)
CREATININE: 0.7 mg/dL (ref 0.40–1.20)
Calcium: 9.5 mg/dL (ref 8.4–10.5)
Chloride: 103 mEq/L (ref 96–112)
GFR: 95.89 mL/min (ref >60.00)
GLUCOSE: 89 mg/dL (ref 70–99)
Potassium: 4.3 mEq/L (ref 3.5–5.1)
Sodium: 136 mEq/L (ref 135–145)
TOTAL PROTEIN: 8.1 g/dL (ref 6.0–8.3)

## 2017-10-12 LAB — LIPID PANEL
Cholesterol: 213 mg/dL — ABNORMAL HIGH (ref 0–200)
HDL: 39.6 mg/dL (ref >39.00)
LDL Cholesterol: 134 mg/dL — ABNORMAL HIGH (ref 0–99)
NONHDL: 173.58
Total CHOL/HDL Ratio: 5
Triglycerides: 200 mg/dL — ABNORMAL HIGH (ref 0.0–149.0)
VLDL: 40 mg/dL (ref 0.0–40.0)

## 2017-10-12 LAB — TSH: TSH: 1.84 u[IU]/mL (ref 0.35–4.50)

## 2017-10-12 LAB — HEMOGLOBIN A1C: Hgb A1c MFr Bld: 5.6 % (ref 4.6–6.5)

## 2017-10-12 LAB — VITAMIN D 25 HYDROXY (VIT D DEFICIENCY, FRACTURES): VITD: 31.23 ng/mL (ref 30.00–100.00)

## 2017-10-12 MED ORDER — LEVONORGESTREL 20 MCG/24HR IU IUD: 1.0000 | INTRAUTERINE_SYSTEM | Freq: Once | INTRAUTERINE | 0 refills | Status: DC

## 2017-10-12 NOTE — Assessment & Plan Note (Signed)
H/o of low vitamin d Not taking MVI or vitamin d now- recheck level Advised restarting vitamin d daily

## 2017-10-12 NOTE — Assessment & Plan Note (Signed)
H/o of mild hyperglycemia Check a1c

## 2017-10-12 NOTE — Assessment & Plan Note (Signed)
Cholesterol has been elevated in the past Check lipids, cmp, tsh Continue regularly exercise Overall eats very healthy

## 2017-10-12 NOTE — Assessment & Plan Note (Signed)
Has an IUD, was on oral birth control prior controlled

## 2017-10-12 NOTE — Assessment & Plan Note (Signed)
Takes otc ranitidine controlled

## 2017-10-12 NOTE — Assessment & Plan Note (Signed)
Managed with exercise, deep breathing Sees a therapist as needed Has taken xanax in the past, but has not taken in over one year

## 2018-01-02 DIAGNOSIS — L821 Other seborrheic keratosis: Secondary | ICD-10-CM | POA: Diagnosis not present

## 2018-01-02 DIAGNOSIS — L814 Other melanin hyperpigmentation: Secondary | ICD-10-CM | POA: Diagnosis not present

## 2018-01-02 DIAGNOSIS — L718 Other rosacea: Secondary | ICD-10-CM | POA: Diagnosis not present

## 2018-01-02 DIAGNOSIS — D225 Melanocytic nevi of trunk: Secondary | ICD-10-CM | POA: Diagnosis not present

## 2018-02-26 ENCOUNTER — Encounter: Payer: Self-pay | Admitting: Internal Medicine

## 2018-02-27 ENCOUNTER — Other Ambulatory Visit: Payer: BLUE CROSS/BLUE SHIELD

## 2018-02-27 ENCOUNTER — Telehealth: Payer: Self-pay

## 2018-02-27 ENCOUNTER — Ambulatory Visit (INDEPENDENT_AMBULATORY_CARE_PROVIDER_SITE_OTHER): Payer: BLUE CROSS/BLUE SHIELD | Admitting: *Deleted

## 2018-02-27 DIAGNOSIS — Z111 Encounter for screening for respiratory tuberculosis: Secondary | ICD-10-CM

## 2018-02-27 DIAGNOSIS — Z1159 Encounter for screening for other viral diseases: Secondary | ICD-10-CM

## 2018-02-27 DIAGNOSIS — Z789 Other specified health status: Secondary | ICD-10-CM

## 2018-02-27 NOTE — Telephone Encounter (Signed)
error 

## 2018-02-28 LAB — HEPATITIS B SURFACE ANTIBODY,QUALITATIVE: Hep B S Ab: NONREACTIVE

## 2018-02-28 LAB — MEASLES/MUMPS/RUBELLA IMMUNITY
Mumps IgG: 178 AU/mL
Rubella: <0.9 index — ABNORMAL LOW
Rubeola IgG: 52.9 AU/mL

## 2018-02-28 LAB — HEPATITIS B SURFACE ANTIGEN: Hepatitis B Surface Ag: NONREACTIVE

## 2018-03-01 ENCOUNTER — Ambulatory Visit (INDEPENDENT_AMBULATORY_CARE_PROVIDER_SITE_OTHER): Payer: BLUE CROSS/BLUE SHIELD | Admitting: *Deleted

## 2018-03-01 DIAGNOSIS — Z23 Encounter for immunization: Secondary | ICD-10-CM

## 2018-03-01 DIAGNOSIS — Z1231 Encounter for screening mammogram for malignant neoplasm of breast: Secondary | ICD-10-CM | POA: Diagnosis not present

## 2018-03-01 LAB — TB SKIN TEST
INDURATION: 0 mm
TB Skin Test: NEGATIVE

## 2018-03-01 LAB — HM MAMMOGRAPHY

## 2018-03-02 DIAGNOSIS — Z130 Encounter for screening for diseases of the blood and blood-forming organs and certain disorders involving the immune mechanism: Secondary | ICD-10-CM | POA: Diagnosis not present

## 2018-03-02 DIAGNOSIS — Z1389 Encounter for screening for other disorder: Secondary | ICD-10-CM | POA: Diagnosis not present

## 2018-03-02 DIAGNOSIS — Z3040 Encounter for surveillance of contraceptives, unspecified: Secondary | ICD-10-CM | POA: Diagnosis not present

## 2018-03-02 DIAGNOSIS — Z01419 Encounter for gynecological examination (general) (routine) without abnormal findings: Secondary | ICD-10-CM | POA: Diagnosis not present

## 2018-03-08 ENCOUNTER — Encounter: Payer: Self-pay | Admitting: Internal Medicine

## 2018-03-09 ENCOUNTER — Encounter: Payer: Self-pay | Admitting: Internal Medicine

## 2018-05-22 ENCOUNTER — Encounter: Payer: Self-pay | Admitting: Internal Medicine

## 2018-07-10 NOTE — Progress Notes (Signed)
Subjective:    Patient ID: Andrea Wells, female    DOB: [birth year], 47 y.o.   MRN: [id number]  HPI She is here for an acute visit for cold symptoms.  Her symptoms started 10 days ago  She is experiencing cough that is productive of green sputum, nasal congestion, sinus pressure, hoarseness, myalgias and headaches.  She denies fever/chills, ear pain, sore throat, wheeze, SOB and nausea.  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    She has taken nothing  Medications and allergies reviewed with patient and updated if appropriate.  Patient Active Problem List   Diagnosis Date Noted  . Menorrhagia 10/12/2017  . Hyperglycemia 10/12/2017  . Vitamin D deficiency 10/12/2017  . Hyperlipidemia 10/12/2017  . GERD (gastroesophageal reflux disease) 08/10/2016  . Family history of malignant neoplasm of gastrointestinal tract 01/25/2014  . Anemia 01/25/2014  . Anxiety 06/04/2013    Current Outpatient Medications on File Prior to Visit  Medication Sig Dispense Refill  . levonorgestrel (MIRENA) 20 MCG/24HR IUD 1 each by Intrauterine route once.    . Multiple Vitamin (MULTIVITAMIN) tablet Take 1 tablet by mouth daily. Reported on 08/12/2015    . ranitidine (ZANTAC) 150 MG tablet Take 1 tablet (150 mg total) by mouth at bedtime.     No current facility-administered medications on file prior to visit.     Past Medical History:  Diagnosis Date  . GERD (gastroesophageal reflux disease)   . Postpartum depression    prev on sertraline for same    Past Surgical History:  Procedure Laterality Date  . CESAREAN SECTION  02/2008 & 09/2005   x 2    Social History   Socioeconomic History  . Marital status: Married    Spouse name: Not on file  . Number of children: 2  . Years of education: Not on file  . Highest education level: Not on file  Occupational History  . Not on file  Social Needs  . Financial resource strain: Not on file  . Food insecurity:    Worry: Not on file     Inability: Not on file  . Transportation needs:    Medical: Not on file    Non-medical: Not on file  Tobacco Use  . Smoking status: Never Smoker  . Smokeless tobacco: Never Used  Substance and Sexual Activity  . Alcohol use: Yes    Comment: occasional  . Drug use: No  . Sexual activity: Not on file  Lifestyle  . Physical activity:    Days per week: Not on file    Minutes per session: Not on file  . Stress: Not on file  Relationships  . Social connections:    Talks on phone: Not on file    Gets together: Not on file    Attends religious service: Not on file    Active member of club or organization: Not on file    Attends meetings of clubs or organizations: Not on file    Relationship status: Not on file  Other Topics Concern  . 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 Crohn's disease Mother        ? dx, improved w/ gluten free diet  . Prostate cancer Father  48  . Colon cancer Father 45  . Hypertension Father   . Alcohol abuse Other   . Diabetes Paternal Aunt   . Breast cancer Paternal Aunt 89  . Breast cancer Paternal Grandmother   . Diabetes Paternal Grandmother   . Heart Problems Maternal Grandfather 50       complications of valve repair - died intraop  . Heart Problems Maternal Uncle        open heart valve repair, died year after    Review of Systems  Constitutional: Negative for chills and fever.  HENT: Positive for congestion (mild), sinus pressure and voice change. Negative for ear pain and sore throat.   Respiratory: Positive for cough (productive green, thick sputum). Negative for shortness of breath and wheezing.   Gastrointestinal: Negative for nausea.  Musculoskeletal: Positive for myalgias.  Neurological: Positive for headaches. Negative for  light-headedness.       Objective:   Vitals:   07/11/18 0859  BP: 116/72  Pulse: 71  Resp: 14  Temp: 97.9 F (36.6 C)  SpO2: 99%   Filed Weights   07/11/18 0859  Weight: 179 lb (81.2 kg)   Body mass index is 30.73 kg/m.  Wt Readings from Last 3 Encounters:  07/11/18 179 lb (81.2 kg)  10/12/17 177 lb (80.3 kg)  10/08/16 179 lb 9.6 oz (81.5 kg)     Physical Exam GENERAL APPEARANCE: mildly ill appearing, NAD EYES: conjunctiva clear, no icterus HEENT: bilateral tympanic membranes and ear canals normal, oropharynx with no erythema, no thyromegaly, trachea midline, no cervical or supraclavicular lymphadenopathy LUNGS: Clear to auscultation without wheeze or crackles, unlabored breathing, good air entry bilaterally CARDIOVASCULAR: Normal S1,S2 without murmurs, no edema SKIN: warm, dry        Assessment & Plan:   See Problem List for Assessment and Plan of chronic medical problems.

## 2018-07-11 ENCOUNTER — Encounter: Payer: Self-pay | Admitting: Internal Medicine

## 2018-07-11 ENCOUNTER — Ambulatory Visit: Payer: BLUE CROSS/BLUE SHIELD | Admitting: Internal Medicine

## 2018-07-11 VITALS — BP 116/72 | HR 71 | Temp 97.9°F | Resp 14 | Ht 64.0 in | Wt 179.0 lb

## 2018-07-11 DIAGNOSIS — J209 Acute bronchitis, unspecified: Secondary | ICD-10-CM | POA: Diagnosis not present

## 2018-07-11 MED ORDER — AZITHROMYCIN 250 MG PO TABS: ORAL_TABLET | ORAL | 0 refills | Status: DC

## 2018-07-11 NOTE — Patient Instructions (Signed)
° ° °  Take the antibiotic as prescribed - complete the entire course.   ° ° °Continue over the counter cold medication, advil and tylenol.  Increase your fluids and rest.  ° ° °Call if no improvement   ° °

## 2018-07-11 NOTE — Assessment & Plan Note (Signed)
Likely bacterial  Start zpak otc cold medications Rest, fluid Call if no improvement  

## 2018-08-08 ENCOUNTER — Encounter: Payer: Self-pay | Admitting: Nurse Practitioner

## 2018-08-08 ENCOUNTER — Ambulatory Visit: Payer: Self-pay | Admitting: Internal Medicine

## 2018-08-08 ENCOUNTER — Ambulatory Visit: Payer: Self-pay | Admitting: Family Medicine

## 2018-08-08 ENCOUNTER — Ambulatory Visit: Payer: Self-pay | Admitting: Physician Assistant

## 2018-08-08 ENCOUNTER — Ambulatory Visit: Payer: BLUE CROSS/BLUE SHIELD | Admitting: Nurse Practitioner

## 2018-08-08 ENCOUNTER — Ambulatory Visit (INDEPENDENT_AMBULATORY_CARE_PROVIDER_SITE_OTHER): Payer: BLUE CROSS/BLUE SHIELD

## 2018-08-08 VITALS — BP 106/70 | HR 68 | Temp 97.7°F | Ht 64.0 in | Wt 181.0 lb

## 2018-08-08 DIAGNOSIS — R05 Cough: Secondary | ICD-10-CM | POA: Diagnosis not present

## 2018-08-08 DIAGNOSIS — R0781 Pleurodynia: Secondary | ICD-10-CM

## 2018-08-08 DIAGNOSIS — J00 Acute nasopharyngitis [common cold]: Secondary | ICD-10-CM

## 2018-08-08 DIAGNOSIS — R079 Chest pain, unspecified: Secondary | ICD-10-CM | POA: Diagnosis not present

## 2018-08-08 DIAGNOSIS — H6983 Other specified disorders of Eustachian tube, bilateral: Secondary | ICD-10-CM | POA: Diagnosis not present

## 2018-08-08 MED ORDER — GUAIFENESIN-DM 100-10 MG/5ML PO SYRP: 5.0000 mL | ORAL_SOLUTION | ORAL | 0 refills | Status: DC | PRN

## 2018-08-08 MED ORDER — CETIRIZINE HCL 10 MG PO TABS: 10.0000 mg | ORAL_TABLET | Freq: Every day | ORAL | 0 refills | Status: DC

## 2018-08-08 NOTE — Progress Notes (Signed)
Subjective:  Patient ID: Andrea Wells, female    DOB: 13-Oct-1970  Age: 47 y.o. MRN: [id number]  CC: Nasal Congestion (pt is c/o of congestion,ears painful,left side jaw pain,neck and back pain when take a deep breath/going on 2 days/going to travel by air plane next week. )  URI   This is a new problem. The current episode started yesterday. The problem has been unchanged. There has been no fever. Associated symptoms include congestion, coughing, ear pain, neck pain, a plugged ear sensation, rhinorrhea and sinus pain. She has tried nothing for the symptoms.  no recent travel, no chest wall injury.  Reviewed past Medical, Social and Family history today.  Outpatient Medications Prior to Visit  Medication Sig Dispense Refill  . levonorgestrel (MIRENA) 20 MCG/24HR IUD 1 each by Intrauterine route once.    . Multiple Vitamin (MULTIVITAMIN) tablet Take 1 tablet by mouth daily. Reported on 08/12/2015    . ranitidine (ZANTAC) 150 MG tablet Take 1 tablet (150 mg total) by mouth at bedtime.    Marland Kitchen azithromycin (ZITHROMAX) 250 MG tablet Take two tabs the first day and then one tab daily for four days (Patient not taking: Reported on 08/08/2018) 6 tablet 0   No facility-administered medications prior to visit.     ROS See HPI  Objective:  BP 106/70   Pulse 68   Temp 97.7 F (36.5 C) (Oral)   Ht 5\' 4"  (1.626 m)   Wt 181 lb (82.1 kg)   SpO2 99%   BMI 31.07 kg/m   BP Readings from Last 3 Encounters:  08/08/18 106/70  07/11/18 116/72  10/12/17 112/76    Wt Readings from Last 3 Encounters:  08/08/18 181 lb (82.1 kg)  07/11/18 179 lb (81.2 kg)  10/12/17 177 lb (80.3 kg)    Physical Exam Vitals signs reviewed.  Constitutional:      General: She is not in acute distress. HENT:     Right Ear: Tympanic membrane, ear canal and external ear normal.     Left Ear: Tympanic membrane, ear canal and external ear normal.     Nose: Mucosal edema and congestion present.     Right Turbinates:  Swollen.     Left Turbinates: Swollen.     Mouth/Throat:     Pharynx: No oropharyngeal exudate or posterior oropharyngeal erythema.  Neck:     Musculoskeletal: Normal range of motion and neck supple.  Cardiovascular:     Rate and Rhythm: Normal rate and regular rhythm.  Pulmonary:     Effort: Pulmonary effort is normal.     Breath sounds: Normal breath sounds.  Chest:     Chest wall: No tenderness.  Neurological:     Mental Status: She is alert and oriented to person, place, and time.     Lab Results  Component Value Date   WBC 6.8 10/12/2017   HGB 13.9 10/12/2017   HCT 41.3 10/12/2017   PLT 365.0 10/12/2017   GLUCOSE 89 10/12/2017   CHOL 213 (H) 10/12/2017   TRIG 200.0 (H) 10/12/2017   HDL 39.60 10/12/2017   LDLDIRECT 150.7 01/16/2013   LDLCALC 134 (H) 10/12/2017   ALT 10 10/12/2017   AST 14 10/12/2017   NA 136 10/12/2017   K 4.3 10/12/2017   CL 103 10/12/2017   CREATININE 0.70 10/12/2017   BUN 10 10/12/2017   CO2 25 10/12/2017   TSH 1.84 10/12/2017   HGBA1C 5.6 10/12/2017    Ct Abdomen Pelvis Wo Contrast  Result Date:  04/06/2013 *RADIOLOGY REPORT* Clinical Data: Left flank pain x6 weeks CT ABDOMEN AND PELVIS WITHOUT CONTRAST Technique:  Multidetector CT imaging of the abdomen and pelvis was performed following the standard protocol without intravenous contrast. Comparison: None. Findings: Lung bases are clear. Unenhanced liver, spleen, pancreas, and adrenal glands are within normal limits. Gallbladder is notable for possible layering sludge.  No intrahepatic or extrahepatic ductal dilatation. Kidneys are within normal limits.  No renal calculi or hydronephrosis. No evidence of bowel obstruction.  Normal appendix. No evidence of abdominal aortic aneurysm. No suspicious abdominopelvic lymphadenopathy. Uterus and bilateral ovaries are unremarkable. Trace pelvic ascites, likely physiologic. No ureteral or bladder calculi. Bladder is unremarkable. Mild degenerative changes of  the visualized thoracolumbar spine. IMPRESSION: No renal, ureteral, or bladder calculi.  No hydronephrosis. No CT findings to account for the patient's left flank pain. Original Report Authenticated By: Julian Hy, M.D.    Assessment & Plan:   Andrea Wells was seen today for nasal congestion.  Diagnoses and all orders for this visit:  Anterior pleuritic pain -     DG Chest 2 View  Acute nasopharyngitis -     guaiFENesin-dextromethorphan (ROBITUSSIN DM) 100-10 MG/5ML syrup; Take 5 mLs by mouth every 4 (four) hours as needed for cough. -     cetirizine (ZYRTEC) 10 MG tablet; Take 1 tablet (10 mg total) by mouth daily.  Eustachian tube dysfunction, bilateral -     guaiFENesin-dextromethorphan (ROBITUSSIN DM) 100-10 MG/5ML syrup; Take 5 mLs by mouth every 4 (four) hours as needed for cough. -     cetirizine (ZYRTEC) 10 MG tablet; Take 1 tablet (10 mg total) by mouth daily.   I am having Andrea Wells start on guaiFENesin-dextromethorphan and cetirizine. I am also having her maintain her multivitamin, ranitidine, levonorgestrel, and azithromycin.  Meds ordered this encounter  Medications  . guaiFENesin-dextromethorphan (ROBITUSSIN DM) 100-10 MG/5ML syrup    Sig: Take 5 mLs by mouth every 4 (four) hours as needed for cough.    Dispense:  118 mL    Refill:  0    Order Specific Question:   Supervising Provider    Answer:   Lucille Passy [[id number]]  . cetirizine (ZYRTEC) 10 MG tablet    Sig: Take 1 tablet (10 mg total) by mouth daily.    Dispense:  14 tablet    Refill:  0    Order Specific Question:   Supervising Provider    Answer:   Lucille Passy [[id number]]    Problem List Items Addressed This Visit    None    Visit Diagnoses    Anterior pleuritic pain    -  Primary   Relevant Orders   DG Chest 2 View (Completed)   Acute nasopharyngitis       Relevant Medications   guaiFENesin-dextromethorphan (ROBITUSSIN DM) 100-10 MG/5ML syrup   cetirizine (ZYRTEC) 10 MG tablet   Eustachian tube  dysfunction, bilateral       Relevant Medications   guaiFENesin-dextromethorphan (ROBITUSSIN DM) 100-10 MG/5ML syrup   cetirizine (ZYRTEC) 10 MG tablet       Follow-up: Return if symptoms worsen or fail to improve.  Wilfred Lacy, NP

## 2018-08-08 NOTE — Patient Instructions (Addendum)
Normal CXR Use robitussin, zyrtec and ibuprofen to manage symptoms. Symptoms are due to viral infection and should resolve in 5-7days.

## 2018-08-10 ENCOUNTER — Encounter: Payer: Self-pay | Admitting: Nurse Practitioner

## 2018-08-11 ENCOUNTER — Ambulatory Visit: Payer: Self-pay | Admitting: Family

## 2018-08-17 DIAGNOSIS — S63642A Sprain of metacarpophalangeal joint of left thumb, initial encounter: Secondary | ICD-10-CM | POA: Diagnosis not present

## 2018-08-17 DIAGNOSIS — M79645 Pain in left finger(s): Secondary | ICD-10-CM | POA: Diagnosis not present

## 2018-08-24 DIAGNOSIS — S63602A Unspecified sprain of left thumb, initial encounter: Secondary | ICD-10-CM | POA: Diagnosis not present

## 2018-10-26 ENCOUNTER — Encounter: Payer: Self-pay | Admitting: Internal Medicine

## 2018-10-26 ENCOUNTER — Ambulatory Visit: Payer: BLUE CROSS/BLUE SHIELD | Admitting: Internal Medicine

## 2018-10-26 DIAGNOSIS — R6889 Other general symptoms and signs: Secondary | ICD-10-CM | POA: Insufficient documentation

## 2018-10-26 LAB — POC INFLUENZA A&B (BINAX/QUICKVUE)
INFLUENZA A, POC: NEGATIVE
Influenza B, POC: NEGATIVE

## 2018-10-26 NOTE — Progress Notes (Signed)
   Subjective:   Patient ID: Andrea Wells, female    DOB: 10/06/1970, 48 y.o.   MRN: [id number]  HPI The patient is a 48 y.o. female coming in for cold symptoms. Started about 1-2 days ago. Main symptoms are: cough, drainage, sore throat. Denies fevers or chills. 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 Overall it is worsening. Has tried nothing. Is feeling some fatigue but no body aches.  Review of Systems  Constitutional: Positive for activity change, appetite change and chills. Negative for fatigue, fever and unexpected weight change.  HENT: Positive for congestion, postnasal drip, rhinorrhea and sinus pressure. Negative for ear discharge, ear pain, sinus pain, sneezing, sore throat, tinnitus, trouble swallowing and voice change.   Eyes: Negative.   Respiratory: Positive for cough. Negative for chest tightness, shortness of breath and wheezing.   Cardiovascular: Negative.   Gastrointestinal: Negative.   Musculoskeletal: Positive for myalgias.  Neurological: Negative.     Objective:  Physical Exam Constitutional:      Appearance: She is well-developed.  HENT:     Head: Normocephalic and atraumatic.     Comments: Oropharynx with redness and clear drainage, nose with swollen turbinates, TMs normal bilaterally.  Neck:     Musculoskeletal: Normal range of motion.     Thyroid: No thyromegaly.  Cardiovascular:     Rate and Rhythm: Normal rate and regular rhythm.  Pulmonary:     Effort: Pulmonary effort is normal. No respiratory distress.     Breath sounds: Normal breath sounds. No wheezing or rales.  Abdominal:     Palpations: Abdomen is soft.  Musculoskeletal:        General: Tenderness present.  Lymphadenopathy:     Cervical: No cervical adenopathy.  Skin:    General: Skin is warm and dry.  Neurological:     Mental Status: She is alert and oriented to person, place, and time.     Vitals:   10/26/18 0843  BP: 122/86  Pulse: 61  Temp: 98.3 F (36.8  C)  TempSrc: Oral  SpO2: 98%  Weight: 181 lb (82.1 kg)  Height: 5\' 4"  (1.626 m)    Assessment & Plan:

## 2018-10-26 NOTE — Assessment & Plan Note (Signed)
Given high risk environment will test for flu although symptoms are more consistent with viral uri. Testing negative. Advised to start zyrtec to help. No antibiotics indicated or steroids.

## 2018-10-26 NOTE — Patient Instructions (Addendum)
Your flu test is negative.   You can start taking zyrtec (cetirizine) 1 pill daily for the next week.

## 2018-11-17 ENCOUNTER — Telehealth: Payer: Self-pay

## 2018-11-17 NOTE — Telephone Encounter (Signed)
LVM letting pt know that I did cancel appointment for 11/22/18 @ 8:30. She can call back to reschedule at her convenience.

## 2018-11-22 ENCOUNTER — Encounter: Payer: Self-pay | Admitting: Internal Medicine

## 2019-03-15 ENCOUNTER — Encounter: Payer: Self-pay | Admitting: Gastroenterology

## 2019-04-04 DIAGNOSIS — Z1231 Encounter for screening mammogram for malignant neoplasm of breast: Secondary | ICD-10-CM | POA: Diagnosis not present

## 2019-05-17 NOTE — Patient Instructions (Addendum)
Tests ordered today. Your results will be released to [facility] (or called to you) after review.  If any changes need to be made, you will be notified at that same time.  All other Health Maintenance issues reviewed.   All recommended immunizations and age-appropriate screenings are up-to-date or discussed.  No immunization administered today.   Medications reviewed and updated.  Changes include :   Take omeprazole 20 mg daily 30 minutes before breakfast for 2 weeks.  If your symptom have resolved stop the medication.  Your prescription(s) have been submitted to your pharmacy. Please take as directed and contact our office if you believe you are having problem(s) with the medication(s).   Please followup in 1 year    Health Maintenance, Female Adopting a healthy lifestyle and getting preventive care are important in promoting health and wellness. Ask your health care provider about:  The right schedule for you to have regular tests and exams.  Things you can do on your own to prevent diseases and keep yourself healthy. What should I know about diet, weight, and exercise? Eat a healthy diet   Eat a diet that includes plenty of vegetables, fruits, low-fat dairy products, and lean protein.  Do not eat a lot of foods that are high in solid fats, added sugars, or sodium. Maintain a healthy weight Body mass index (BMI) is used to identify weight problems. It estimates body fat based on height and weight. Your health care provider can help determine your BMI and help you achieve or maintain a healthy weight. Get regular exercise Get regular exercise. This is one of the most important things you can do for your health. Most adults should:  Exercise for at least 150 minutes each week. The exercise should increase your heart rate and make you sweat (moderate-intensity exercise).  Do strengthening exercises at least twice a week. This is in addition to the moderate-intensity exercise.   Spend less time sitting. Even light physical activity can be beneficial. Watch cholesterol and blood lipids Have your blood tested for lipids and cholesterol at 47 years of age, then have this test every 5 years. Have your cholesterol levels checked more often if:  Your lipid or cholesterol levels are high.  You are older than 47 years of age.  You are at high risk for heart disease. What should I know about cancer screening? Depending on your health history and family history, you may need to have cancer screening at various ages. This may include screening for:  Breast cancer.  Cervical cancer.  Colorectal cancer.  Skin cancer.  Lung cancer. What should I know about heart disease, diabetes, and high blood pressure? Blood pressure and heart disease  High blood pressure causes heart disease and increases the risk of stroke. This is more likely to develop in people who have high blood pressure readings, are of African descent, or are overweight.  Have your blood pressure checked: ? Every 3-5 years if you are 66-53 years of age. ? Every year if you are 20 years old or older. Diabetes Have regular diabetes screenings. This checks your fasting blood sugar level. Have the screening done:  Once every three years after age 69 if you are at a normal weight and have a low risk for diabetes.  More often and at a younger age if you are overweight or have a high risk for diabetes. What should I know about preventing infection? Hepatitis B If you have a higher risk for hepatitis B, you  should be screened for this virus. Talk with your health care provider to find out if you are at risk for hepatitis B infection. Hepatitis C Testing is recommended for:  Everyone born from [birth year] through 1965.  Anyone with known risk factors for hepatitis C. Sexually transmitted infections (STIs)  Get screened for STIs, including gonorrhea and chlamydia, if: ? You are sexually active and are younger  than 47 years of age. ? You are older than 47 years of age and your health care provider tells you that you are at risk for this type of infection. ? Your sexual activity has changed since you were last screened, and you are at increased risk for chlamydia or gonorrhea. Ask your health care provider if you are at risk.  Ask your health care provider about whether you are at high risk for HIV. Your health care provider may recommend a prescription medicine to help prevent HIV infection. If you choose to take medicine to prevent HIV, you should first get tested for HIV. You should then be tested every 3 months for as long as you are taking the medicine. Pregnancy  If you are about to stop having your period (premenopausal) and you may become pregnant, seek counseling before you get pregnant.  Take 400 to 800 micrograms (mcg) of folic acid every day if you become pregnant.  Ask for birth control (contraception) if you want to prevent pregnancy. Osteoporosis and menopause Osteoporosis is a disease in which the bones lose minerals and strength with aging. This can result in bone fractures. If you are 66 years old or older, or if you are at risk for osteoporosis and fractures, ask your health care provider if you should:  Be screened for bone loss.  Take a calcium or vitamin D supplement to lower your risk of fractures.  Be given hormone replacement therapy (HRT) to treat symptoms of menopause. Follow these instructions at home: Lifestyle  Do not use any products that contain nicotine or tobacco, such as cigarettes, e-cigarettes, and chewing tobacco. If you need help quitting, ask your health care provider.  Do not use street drugs.  Do not share needles.  Ask your health care provider for help if you need support or information about quitting drugs. Alcohol use  Do not drink alcohol if: ? Your health care provider tells you not to drink. ? You are pregnant, may be pregnant, or are  planning to become pregnant.  If you drink alcohol: ? Limit how much you use to 0-1 drink a day. ? Limit intake if you are breastfeeding.  Be aware of how much alcohol is in your drink. In the [place] one drink equals one 12 oz bottle of beer (355 mL), one 5 oz glass of wine (148 mL), or one 1 oz glass of hard liquor (44 mL). General instructions  Schedule regular health, dental, and eye exams.  Stay current with your vaccines.  Tell your health care provider if: ? You often feel depressed. ? You have ever been abused or do not feel safe at home. Summary  Adopting a healthy lifestyle and getting preventive care are important in promoting health and wellness.  Follow your health care provider's instructions about healthy diet, exercising, and getting tested or screened for diseases.  Follow your health care provider's instructions on monitoring your cholesterol and blood pressure. This information is not intended to replace advice given to you by your health care provider. Make sure you discuss any questions you have with  your health care provider. Document Released: 02/22/2011 Document Revised: 08/02/2018 Document Reviewed: 08/02/2018 Elsevier Patient Education  2020 [employer].

## 2019-05-17 NOTE — Progress Notes (Signed)
Subjective:    Patient ID: Andrea Wells, female    DOB: 09-Aug-1971, 48 y.o.   MRN: [id number]  HPI She is here for a physical exam.   For the past 2-3 weeks burning in epigastric region and it spreads out in both directions.  Not all the time.  Sometimes if she bends over she will feel it. It only occurs when she lays down at night.  Rarely eats after 6:30. Yesterday she ate jalapeno's and felt it.  She does like spicy foods.  She does not take nsaids.    She feels bloated at times.  She has constipation that is mild on occasion, but that is not new.  One or two times she has had a burning sensation all over her abdomen.  It will wake her up at night.     She took a leave of absence from work for one year.  Has increased stress.    Medications and allergies reviewed with patient and updated if appropriate.  Patient Active Problem List   Diagnosis Date Noted  . Menorrhagia 10/12/2017  . Hyperglycemia 10/12/2017  . Vitamin D deficiency 10/12/2017  . Hyperlipidemia 10/12/2017  . GERD (gastroesophageal reflux disease) 08/10/2016  . Family history of malignant neoplasm of gastrointestinal tract 01/25/2014  . Anemia 01/25/2014  . Anxiety 06/04/2013    Current Outpatient Medications on File Prior to Visit  Medication Sig Dispense Refill  . cetirizine (ZYRTEC) 10 MG tablet Take 1 tablet (10 mg total) by mouth daily. 14 tablet 0  . famotidine (PEPCID) 40 MG tablet Take 40 mg by mouth daily.    Marland Kitchen levonorgestrel (MIRENA) 20 MCG/24HR IUD 1 each by Intrauterine route once.    . Multiple Vitamin (MULTIVITAMIN) tablet Take 1 tablet by mouth daily. Reported on 08/12/2015     No current facility-administered medications on file prior to visit.     Past Medical History:  Diagnosis Date  . GERD (gastroesophageal reflux disease)   . Postpartum depression    prev on sertraline for same    Past Surgical History:  Procedure Laterality Date  . CESAREAN SECTION  02/2008 & 09/2005   x 2    Social History   Socioeconomic History  . Marital status: Married    Spouse name: Not on file  . Number of children: 2  . Years of education: Not on file  . Highest education level: Not on file  Occupational History  . Not on file  Social Needs  . Financial resource strain: Not on file  . Food insecurity    Worry: Not on file    Inability: Not on file  . Transportation needs    Medical: Not on file    Non-medical: Not on file  Tobacco Use  . Smoking status: Never Smoker  . Smokeless tobacco: Never Used  Substance and Sexual Activity  . Alcohol use: Yes    Comment: occasional  . Drug use: No  . Sexual activity: Not on file  Lifestyle  . Physical activity    Days per week: Not on file    Minutes per session: Not on file  . Stress: Not on file  Relationships  . Social Herbalist on phone: Not on file    Gets together: Not on file    Attends religious service: Not on file    Active member of club or organization: Not on file    Attends meetings of clubs or organizations: Not on file  Relationship status: Not on file  Other Topics Concern  . 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 Crohn's disease Mother        ? dx, improved w/ gluten free diet  . Prostate cancer Father 64  . Colon cancer Father 67  . Hypertension Father   . Alcohol abuse Other   . Diabetes Paternal Aunt   . Breast cancer Paternal Aunt 63  . Breast cancer Paternal Grandmother   . Diabetes Paternal Grandmother   . Heart Problems Maternal Grandfather 50       complications of valve repair - died intraop  . Heart Problems Maternal Uncle        open heart valve repair, died year after    Review of Systems  Constitutional: Negative for chills and fever.  Eyes: Negative for visual  disturbance.  Respiratory: Negative for cough, shortness of breath and wheezing.   Cardiovascular: Negative for chest pain, palpitations and leg swelling.  Gastrointestinal: Positive for abdominal distention (bloating, increase gas) and constipation (mild, intermittent). Negative for abdominal pain, blood in stool, diarrhea and nausea.  Genitourinary: Negative for dysuria and hematuria.  Musculoskeletal: Negative for arthralgias.  Skin: Negative for color change and rash.  Neurological: Positive for headaches (sinus). Negative for dizziness and light-headedness.  Psychiatric/Behavioral: Positive for dysphoric mood. The patient is nervous/anxious.        Objective:   Vitals:   05/18/19 1459  BP: 108/78  Pulse: (!) 55  Temp: 98.5 F (36.9 C)  SpO2: 99%   Filed Weights   05/18/19 1459  Weight: 169 lb (76.7 kg)   Body mass index is 29.01 kg/m.  BP Readings from Last 3 Encounters:  05/18/19 108/78  10/26/18 122/86  08/08/18 106/70    Wt Readings from Last 3 Encounters:  05/18/19 169 lb (76.7 kg)  10/26/18 181 lb (82.1 kg)  08/08/18 181 lb (82.1 kg)     Physical Exam Constitutional: She appears well-developed and well-nourished. No distress.  HENT:  Head: Normocephalic and atraumatic.  Right Ear: External ear normal. Normal ear canal and TM Left Ear: External ear normal.  Normal ear canal and TM Mouth/Throat: Oropharynx is clear and moist.  Eyes: Conjunctivae and EOM are normal.  Neck: Neck supple. No tracheal deviation present. No thyromegaly present.  No carotid bruit  Cardiovascular: Normal rate, regular rhythm and normal heart sounds.   No murmur heard.  No edema. Pulmonary/Chest: Effort normal and breath sounds normal. No respiratory distress. She has no wheezes. She has no rales.  Breast: deferred   Abdominal: Soft. She exhibits no distension. There is no tenderness.  Lymphadenopathy: She has no cervical adenopathy.  Skin: Skin is warm and dry. She is not  diaphoretic.  Psychiatric: She has a normal mood and affect. Her behavior is normal.        Assessment & Plan:   Physical exam: Screening blood work    ordered Immunizations  Flu vaccine up to date, td up to date Colonoscopy  Due  - family history of colon ca - will schedule for Nov Mammogram     Up to date  Gyn   Up to date  Exercise  regularly Weight  Overweight, but has lost a significant amount of weight  Substance abuse   none  See Problem List for Assessment and Plan of chronic medical problems.   FU in one year

## 2019-05-18 ENCOUNTER — Other Ambulatory Visit: Payer: Self-pay

## 2019-05-18 ENCOUNTER — Encounter: Payer: Self-pay | Admitting: Internal Medicine

## 2019-05-18 ENCOUNTER — Ambulatory Visit (INDEPENDENT_AMBULATORY_CARE_PROVIDER_SITE_OTHER): Payer: BC Managed Care – PPO | Admitting: Internal Medicine

## 2019-05-18 VITALS — BP 108/78 | HR 55 | Temp 98.5°F | Ht 64.0 in | Wt 169.0 lb

## 2019-05-18 DIAGNOSIS — Z0000 Encounter for general adult medical examination without abnormal findings: Secondary | ICD-10-CM | POA: Diagnosis not present

## 2019-05-18 DIAGNOSIS — E785 Hyperlipidemia, unspecified: Secondary | ICD-10-CM

## 2019-05-18 DIAGNOSIS — R739 Hyperglycemia, unspecified: Secondary | ICD-10-CM

## 2019-05-18 DIAGNOSIS — K219 Gastro-esophageal reflux disease without esophagitis: Secondary | ICD-10-CM | POA: Diagnosis not present

## 2019-05-18 MED ORDER — OMEPRAZOLE 20 MG PO CPDR: 20.0000 mg | DELAYED_RELEASE_CAPSULE | Freq: Every day | ORAL | 0 refills | Status: DC

## 2019-05-18 NOTE — Assessment & Plan Note (Signed)
a1c

## 2019-05-18 NOTE — Assessment & Plan Note (Signed)
Check lipid panel, cmp, tsh Regular exercise and healthy diet encouraged   

## 2019-05-18 NOTE — Assessment & Plan Note (Signed)
?   Burning sensation related to GERD Will start omeprazole daily before breakfast for 2 weeks, then stop if symptoms resolved Continue pepcid If no improvement she will let me know

## 2019-05-21 ENCOUNTER — Encounter: Payer: Self-pay | Admitting: Internal Medicine

## 2019-05-21 ENCOUNTER — Other Ambulatory Visit (INDEPENDENT_AMBULATORY_CARE_PROVIDER_SITE_OTHER): Payer: BC Managed Care – PPO

## 2019-05-21 DIAGNOSIS — E785 Hyperlipidemia, unspecified: Secondary | ICD-10-CM

## 2019-05-21 DIAGNOSIS — Z0000 Encounter for general adult medical examination without abnormal findings: Secondary | ICD-10-CM | POA: Diagnosis not present

## 2019-05-21 DIAGNOSIS — R739 Hyperglycemia, unspecified: Secondary | ICD-10-CM | POA: Diagnosis not present

## 2019-05-21 LAB — COMPREHENSIVE METABOLIC PANEL
ALT: 13 U/L (ref 0–35)
AST: 16 U/L (ref 0–37)
Albumin: 4.1 g/dL (ref 3.5–5.2)
Alkaline Phosphatase: 41 U/L (ref 39–117)
BUN: 10 mg/dL (ref 6–23)
CO2: 26 mEq/L (ref 19–32)
Calcium: 9.2 mg/dL (ref 8.4–10.5)
Chloride: 105 mEq/L (ref 96–112)
Creatinine, Ser: 0.67 mg/dL (ref 0.40–1.20)
GFR: 94.24 mL/min (ref >60.00)
Glucose, Bld: 89 mg/dL (ref 70–99)
Potassium: 4 mEq/L (ref 3.5–5.1)
Sodium: 138 mEq/L (ref 135–145)
Total Bilirubin: 0.7 mg/dL (ref 0.2–1.2)
Total Protein: 7.1 g/dL (ref 6.0–8.3)

## 2019-05-21 LAB — CBC WITH DIFFERENTIAL/PLATELET
Basophils Absolute: 0.1 10*3/uL (ref 0.0–0.1)
Basophils Relative: 0.7 % (ref 0.0–3.0)
Eosinophils Absolute: 0.2 10*3/uL (ref 0.0–0.7)
Eosinophils Relative: 2.3 % (ref 0.0–5.0)
HCT: 37.8 % (ref 36.0–46.0)
Hemoglobin: 12.9 g/dL (ref 12.0–15.0)
Lymphocytes Relative: 32.4 % (ref 12.0–46.0)
Lymphs Abs: 2.4 10*3/uL (ref 0.7–4.0)
MCHC: 34.1 g/dL (ref 30.0–36.0)
MCV: 91.5 fl (ref 78.0–100.0)
Monocytes Absolute: 0.5 10*3/uL (ref 0.1–1.0)
Monocytes Relative: 6.4 % (ref 3.0–12.0)
Neutro Abs: 4.3 10*3/uL (ref 1.4–7.7)
Neutrophils Relative %: 58.2 % (ref 43.0–77.0)
Platelets: 249 10*3/uL (ref 150.0–400.0)
RBC: 4.13 Mil/uL (ref 3.87–5.11)
RDW: 12.8 % (ref 11.5–15.5)
WBC: 7.3 10*3/uL (ref 4.0–10.5)

## 2019-05-21 LAB — LIPID PANEL
Cholesterol: 154 mg/dL (ref 0–200)
HDL: 44 mg/dL (ref >39.00)
LDL Cholesterol: 85 mg/dL (ref 0–99)
NonHDL: 110.27
Total CHOL/HDL Ratio: 4
Triglycerides: 124 mg/dL (ref 0.0–149.0)
VLDL: 24.8 mg/dL (ref 0.0–40.0)

## 2019-05-21 LAB — HEMOGLOBIN A1C: Hgb A1c MFr Bld: 5.6 % (ref 4.6–6.5)

## 2019-05-21 LAB — TSH: TSH: 3.21 u[IU]/mL (ref 0.35–4.50)

## 2019-06-01 DIAGNOSIS — Z30431 Encounter for routine checking of intrauterine contraceptive device: Secondary | ICD-10-CM | POA: Diagnosis not present

## 2019-06-01 DIAGNOSIS — Z130 Encounter for screening for diseases of the blood and blood-forming organs and certain disorders involving the immune mechanism: Secondary | ICD-10-CM | POA: Diagnosis not present

## 2019-06-01 DIAGNOSIS — Z01419 Encounter for gynecological examination (general) (routine) without abnormal findings: Secondary | ICD-10-CM | POA: Diagnosis not present

## 2019-06-01 DIAGNOSIS — Z1389 Encounter for screening for other disorder: Secondary | ICD-10-CM | POA: Diagnosis not present

## 2019-07-02 ENCOUNTER — Encounter: Payer: Self-pay | Admitting: Gastroenterology

## 2019-07-18 ENCOUNTER — Other Ambulatory Visit: Payer: Self-pay

## 2019-07-18 ENCOUNTER — Encounter: Payer: Self-pay | Admitting: Gastroenterology

## 2019-07-18 ENCOUNTER — Ambulatory Visit (AMBULATORY_SURGERY_CENTER): Payer: BC Managed Care – PPO | Admitting: *Deleted

## 2019-07-18 VITALS — Temp 97.4°F | Ht 64.0 in | Wt 169.0 lb

## 2019-07-18 DIAGNOSIS — Z1159 Encounter for screening for other viral diseases: Secondary | ICD-10-CM

## 2019-07-18 DIAGNOSIS — Z800 Family history of malignant neoplasm of digestive organs: Secondary | ICD-10-CM

## 2019-07-18 MED ORDER — NA SULFATE-K SULFATE-MG SULF 17.5-3.13-1.6 GM/177ML PO SOLN: ORAL | 0 refills | Status: DC

## 2019-07-18 NOTE — Progress Notes (Signed)
Patient is here in-person for PV. Patient denies any allergies to eggs or soy. Patient denies any problems with anesthesia/sedation. Patient denies any oxygen use at home. Patient denies taking any diet/weight loss medications or blood thinners. Patient is not being treated for MRSA or C-diff. EMMI education assisgned to the patient for the procedure, this was explained and instructions given to patient. COVID-19 screening test is on 12/8 110pm, the pt is aware. Pt is aware that care partner will wait in the car during procedure; if they feel like they will be too hot or cold to wait in the car; they may wait in the 4 th floor lobby. Patient is aware to bring only one care partner. We want them to wear a mask (we do not have any that we can provide them), practice social distancing, and we will check their temperatures when they get here.  I did remind the patient that their care partner needs to stay in the parking lot the entire time and have a cell phone available, we will call them when the pt is ready for discharge. Patient will wear mask into building.    Suprep $15 off coupon given to the patient.

## 2019-07-31 ENCOUNTER — Other Ambulatory Visit: Payer: Self-pay | Admitting: Gastroenterology

## 2019-07-31 ENCOUNTER — Ambulatory Visit (INDEPENDENT_AMBULATORY_CARE_PROVIDER_SITE_OTHER): Payer: BC Managed Care – PPO

## 2019-07-31 DIAGNOSIS — Z1159 Encounter for screening for other viral diseases: Secondary | ICD-10-CM | POA: Diagnosis not present

## 2019-08-01 LAB — SARS CORONAVIRUS 2 (TAT 6-24 HRS): SARS Coronavirus 2: NEGATIVE

## 2019-08-03 ENCOUNTER — Other Ambulatory Visit: Payer: Self-pay

## 2019-08-03 ENCOUNTER — Encounter: Payer: Self-pay | Admitting: Gastroenterology

## 2019-08-03 ENCOUNTER — Ambulatory Visit (AMBULATORY_SURGERY_CENTER): Payer: BC Managed Care – PPO | Admitting: Gastroenterology

## 2019-08-03 VITALS — BP 103/58 | HR 52 | Temp 98.3°F | Resp 19 | Ht 64.0 in | Wt 169.0 lb

## 2019-08-03 DIAGNOSIS — D124 Benign neoplasm of descending colon: Secondary | ICD-10-CM

## 2019-08-03 DIAGNOSIS — Z800 Family history of malignant neoplasm of digestive organs: Secondary | ICD-10-CM

## 2019-08-03 DIAGNOSIS — Z1211 Encounter for screening for malignant neoplasm of colon: Secondary | ICD-10-CM | POA: Diagnosis not present

## 2019-08-03 MED ORDER — SODIUM CHLORIDE 0.9 % IV SOLN: 500.0000 mL | Freq: Once | INTRAVENOUS | Status: DC

## 2019-08-03 NOTE — Progress Notes (Signed)
Temp check by:JR Vital check by:Lucien  The patient states no changes in medical or surgical history since pre-visit screening on 07/18/2019.

## 2019-08-03 NOTE — Patient Instructions (Signed)
Impression/Recommendations:  Polyp handout given to patient.  Resume previous diet. Continue present medications. Await pathology results.  Repeat colonoscopy in 5 years for surveillance regardless of pathology results, given patient's family history.  YOU HAD AN ENDOSCOPIC PROCEDURE TODAY AT [facility]:   Refer to the procedure report that was given to you for any specific questions about what was found during the examination.  If the procedure report does not answer your questions, please call your gastroenterologist to clarify.  If you requested that your care partner not be given the details of your procedure findings, then the procedure report has been included in a sealed envelope for you to review at your convenience later.  YOU SHOULD EXPECT: Some feelings of bloating in the abdomen. Passage of more gas than usual.  Walking can help get rid of the air that was put into your GI tract during the procedure and reduce the bloating. If you had a lower endoscopy (such as a colonoscopy or flexible sigmoidoscopy) you may notice spotting of blood in your stool or on the toilet paper. If you underwent a bowel prep for your procedure, you may not have a normal bowel movement for a few days.  Please Note:  You might notice some irritation and congestion in your nose or some drainage.  This is from the oxygen used during your procedure.  There is no need for concern and it should clear up in a day or so.  SYMPTOMS TO REPORT IMMEDIATELY:   Following lower endoscopy (colonoscopy or flexible sigmoidoscopy):  Excessive amounts of blood in the stool  Significant tenderness or worsening of abdominal pains  Swelling of the abdomen that is new, acute  Fever of 100F or higher  For urgent or emergent issues, a gastroenterologist can be reached at any hour by calling [phone].   DIET:  We do recommend a small meal at first, but then you may proceed to your regular diet.  Drink  plenty of fluids but you should avoid alcoholic beverages for 24 hours.  ACTIVITY:  You should plan to take it easy for the rest of today and you should NOT DRIVE or use heavy machinery until tomorrow (because of the sedation medicines used during the test).    FOLLOW UP: Our staff will call the number listed on your records 48-72 hours following your procedure to check on you and address any questions or concerns that you may have regarding the information given to you following your procedure. If we do not reach you, we will leave a message.  We will attempt to reach you two times.  During this call, we will ask if you have developed any symptoms of COVID 19. If you develop any symptoms (ie: fever, flu-like symptoms, shortness of breath, cough etc.) before then, please call [phone].  If you test positive for Covid 19 in the 2 weeks post procedure, please call and report this information to [place].    If any biopsies were taken you will be contacted by phone or by letter within the next 1-3 weeks.  Please call us at [phone] if you have not heard about the biopsies in 3 weeks.    SIGNATURES/CONFIDENTIALITY: You and/or your care partner have signed paperwork which will be entered into your electronic medical record.  These signatures attest to the fact that that the information above on your After Visit Summary has been reviewed and is understood.  Full responsibility of the confidentiality of this discharge information  lies with you and/or your care-partner.

## 2019-08-03 NOTE — Op Note (Signed)
[facility] Patient Name: Andrea Wells Procedure Date: 08/03/2019 1:18 PM MRN: [id number] Endoscopist: Thornton Park MD, MD Age: 48 Referring MD:  Date of Birth: 06/17/1971 Gender: Female Account #: [id number] Procedure:                Colonoscopy Indications:              Screening in patient at increased risk: Family                            history of 1st-degree relative with colorectal                            cancer before age 19 years                           Father with colon cancer in his 75s                           Colonoscopy with Dr. Deatra Ina 03/10/14: polypoid                            colonic mucosa removed, no polyps Medicines:                Monitored Anesthesia Care Procedure:                Pre-Anesthesia Assessment:                           - Prior to the procedure, a History and Physical                            was performed, and patient medications and                            allergies were reviewed. The patient's tolerance of                            previous anesthesia was also reviewed. The risks                            and benefits of the procedure and the sedation                            options and risks were discussed with the patient.                            All questions were answered, and informed consent                            was obtained. Prior Anticoagulants: The patient has                            taken no previous anticoagulant or antiplatelet  agents. ASA Grade Assessment: II - A patient with                            mild systemic disease. After reviewing the risks                            and benefits, the patient was deemed in                            satisfactory condition to undergo the procedure.                           After obtaining informed consent, the colonoscope                            was passed under direct vision. Throughout the      procedure, the patient's blood pressure, pulse, and                            oxygen saturations were monitored continuously. The                            Colonoscope was introduced through the anus and                            advanced to the the terminal ileum, with                            identification of the appendiceal orifice and IC                            valve. A second forward view of the right colon was                            performed. The colonoscopy was performed without                            difficulty. The patient tolerated the procedure                            well. The quality of the bowel preparation was                            good. The terminal ileum, ileocecal valve,                            appendiceal orifice, and rectum were photographed. Scope In: 1:23:53 PM Scope Out: 1:36:50 PM Scope Withdrawal Time: 0 hours 10 minutes 13 seconds  Total Procedure Duration: 0 hours 12 minutes 57 seconds  Findings:                 The perianal and digital rectal examinations were  normal.                           A 1 mm polyp was found in the descending colon. The                            polyp was sessile. The polyp was removed with a                            piecemeal technique using a cold biopsy forceps.                            Resection and retrieval were complete. Estimated                            blood loss was minimal.                           The colon (entire examined portion) appeared normal.                           The terminal ileum appeared normal.                           The exam was otherwise without abnormality on                            direct and retroflexion views. Complications:            No immediate complications. Estimated blood loss:                            Minimal. Estimated Blood Loss:     Estimated blood loss was minimal. Impression:               - One 1 mm polyp in the  descending colon, removed                            piecemeal using a cold biopsy forceps. Resected and                            retrieved.                           - The entire examined colon is normal.                           - The examined portion of the ileum was normal.                           - The examination was otherwise normal on direct                            and retroflexion views. Recommendation:           - Patient has a contact number available for  emergencies. The signs and symptoms of potential                            delayed complications were discussed with the                            patient. Return to normal activities tomorrow.                            Written discharge instructions were provided to the                            patient.                           - Resume previous diet today.                           - Continue present medications.                           - Await pathology results.                           - Repeat colonoscopy in 5 years for surveillance                            regardless of the pathology results given the                            family history. Thornton Park MD, MD 08/03/2019 1:42:15 PM This report has been signed electronically.

## 2019-08-03 NOTE — Progress Notes (Signed)
A and O x3. Report to RN. Tolerated MAC anesthesia well.

## 2019-08-07 ENCOUNTER — Telehealth: Payer: Self-pay

## 2019-08-07 NOTE — Telephone Encounter (Signed)
  Follow up Call-  Call back number 08/03/2019  Post procedure Call Back phone  # [phone]  Permission to leave phone message Yes  Some recent data might be hidden     Patient questions:  Do you have a fever, pain , or abdominal swelling? No. Pain Score  0 *  Have you tolerated food without any problems? Yes.    Have you been able to return to your normal activities? Yes.    Do you have any questions about your discharge instructions: Diet   No. Medications  No. Follow up visit  No.  Do you have questions or concerns about your Care? No.  Actions: * If pain score is 4 or above: No action needed, pain <4.  1. Have you developed a fever since your procedure? no  2.   Have you had an respiratory symptoms (SOB or cough) since your procedure? no  3.   Have you tested positive for COVID 19 since your procedure no  4.   Have you had any family members/close contacts diagnosed with the COVID 19 since your procedure?  no   If yes to any of these questions please route to Joylene John, RN and Alphonsa Gin, Therapist, sports.

## 2019-08-07 NOTE — Telephone Encounter (Signed)
Called [phone] and left a message we tried to reach pt for a follow up call. maw

## 2019-08-08 ENCOUNTER — Encounter: Payer: Self-pay | Admitting: Gastroenterology

## 2019-12-25 DIAGNOSIS — L718 Other rosacea: Secondary | ICD-10-CM | POA: Diagnosis not present

## 2019-12-25 DIAGNOSIS — D225 Melanocytic nevi of trunk: Secondary | ICD-10-CM | POA: Diagnosis not present

## 2020-04-07 DIAGNOSIS — Z1231 Encounter for screening mammogram for malignant neoplasm of breast: Secondary | ICD-10-CM | POA: Diagnosis not present

## 2020-04-11 ENCOUNTER — Encounter: Payer: Self-pay | Admitting: Internal Medicine

## 2020-08-04 DIAGNOSIS — Z01419 Encounter for gynecological examination (general) (routine) without abnormal findings: Secondary | ICD-10-CM | POA: Diagnosis not present

## 2020-08-04 DIAGNOSIS — Z1151 Encounter for screening for human papillomavirus (HPV): Secondary | ICD-10-CM | POA: Diagnosis not present

## 2020-08-04 DIAGNOSIS — Z130 Encounter for screening for diseases of the blood and blood-forming organs and certain disorders involving the immune mechanism: Secondary | ICD-10-CM | POA: Diagnosis not present

## 2020-08-04 DIAGNOSIS — Z124 Encounter for screening for malignant neoplasm of cervix: Secondary | ICD-10-CM | POA: Diagnosis not present

## 2020-08-05 DIAGNOSIS — Z124 Encounter for screening for malignant neoplasm of cervix: Secondary | ICD-10-CM | POA: Diagnosis not present

## 2020-08-05 DIAGNOSIS — Z1151 Encounter for screening for human papillomavirus (HPV): Secondary | ICD-10-CM | POA: Diagnosis not present

## 2020-11-18 IMAGING — DX DG CHEST 2V
2 series · 2 of 2 positions shown · non-contrast
Comparison: None.

CLINICAL DATA: Nonproductive cough.  Chest pain.

EXAM:
CHEST - 2 VIEW

[chest pa]
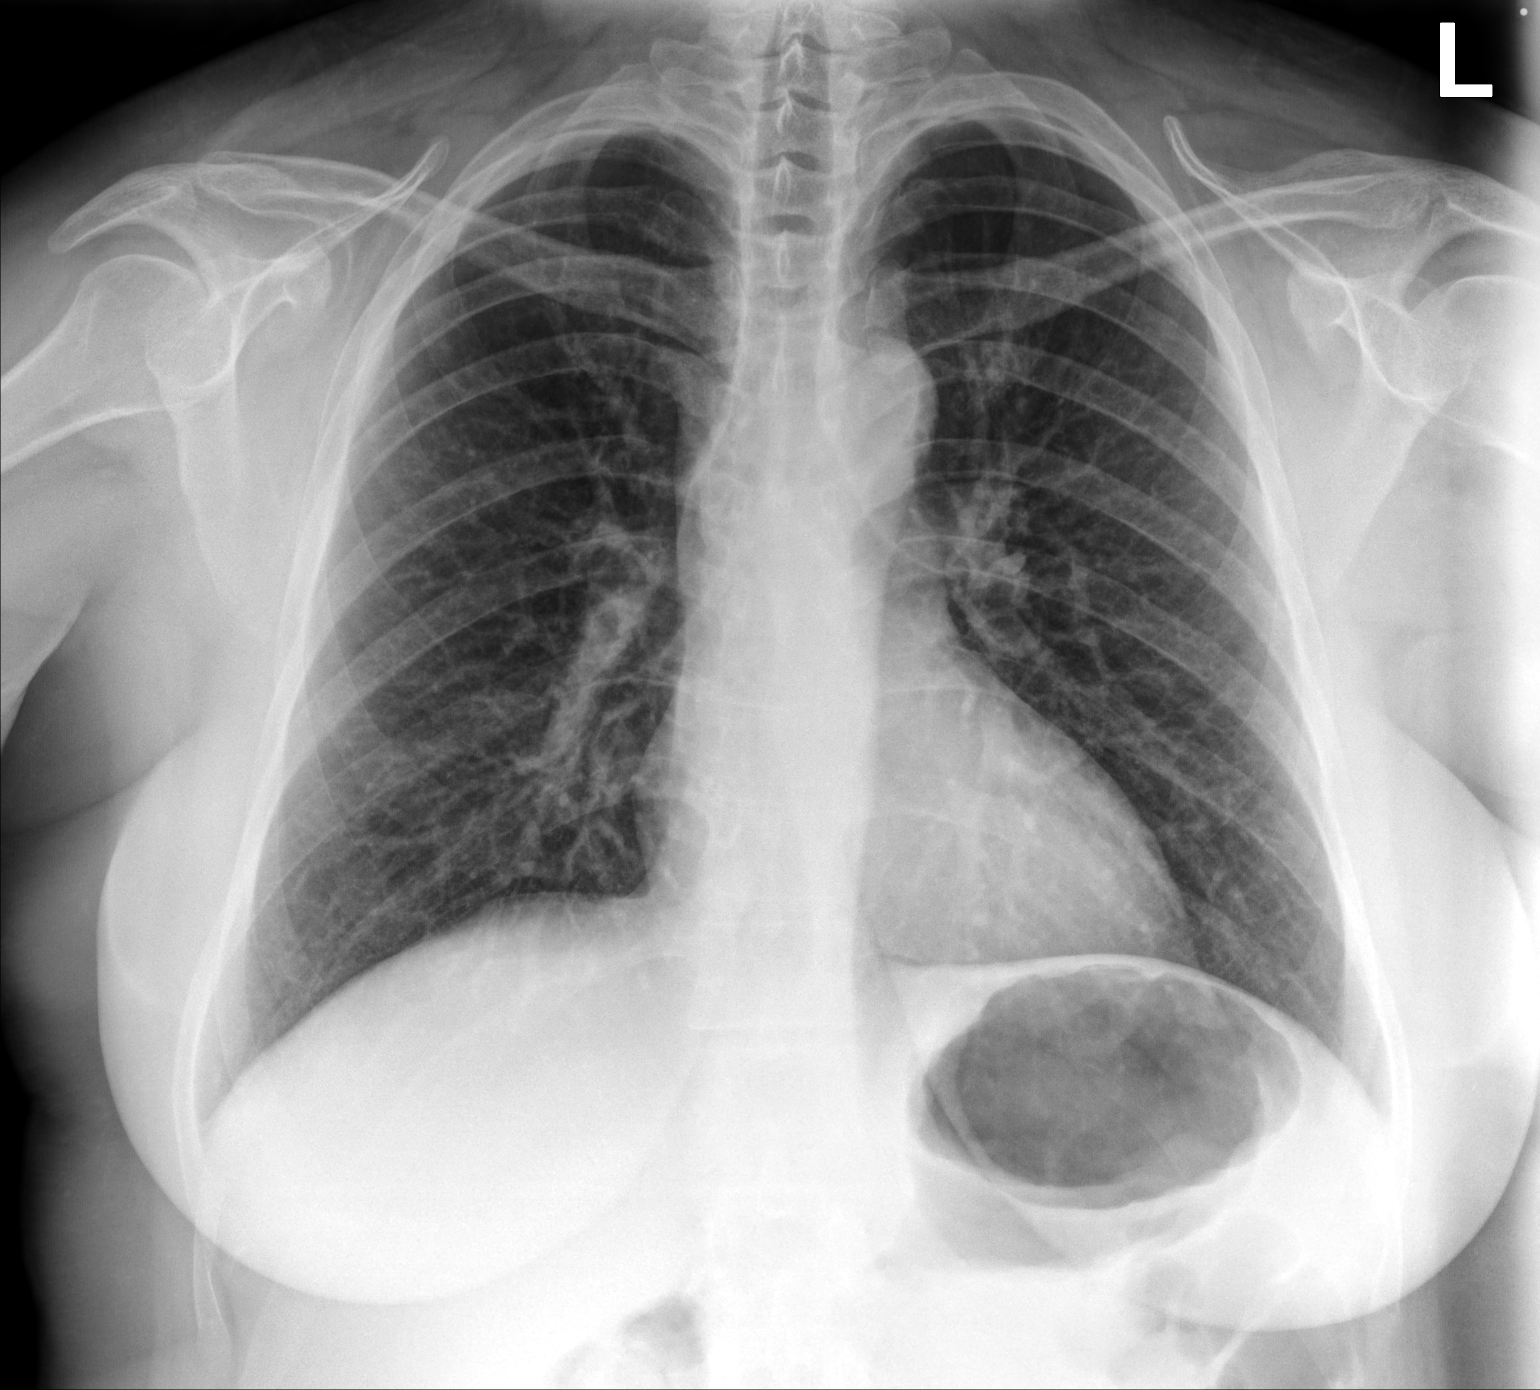

[chest lat]
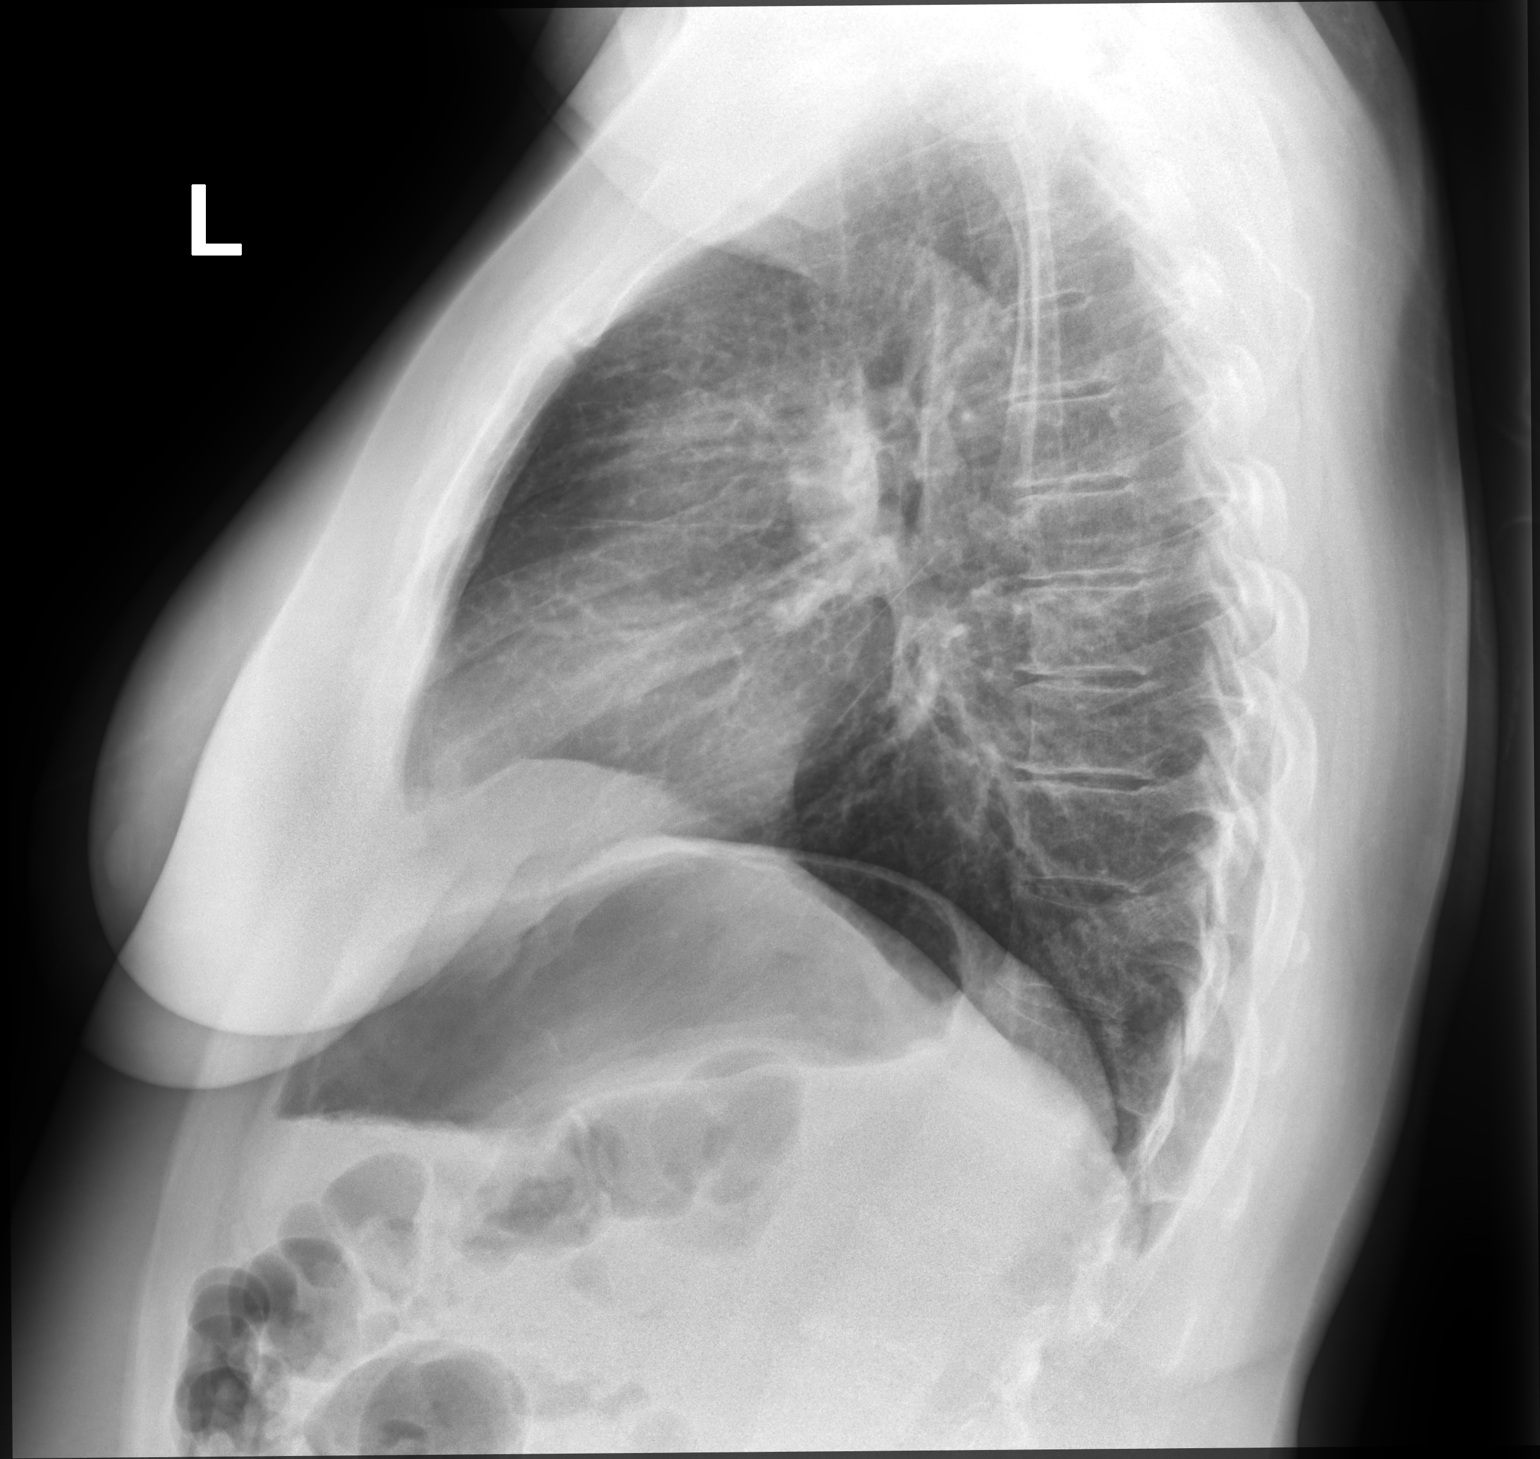

[2 of 2 positions shown; findings below may reference images not displayed]

FINDINGS: The heart size and mediastinal contours are within normal limits.
Both lungs are clear. The visualized skeletal structures are
unremarkable.
IMPRESSION: No active cardiopulmonary disease.

## 2021-03-30 DIAGNOSIS — D1801 Hemangioma of skin and subcutaneous tissue: Secondary | ICD-10-CM | POA: Diagnosis not present

## 2021-03-30 DIAGNOSIS — L905 Scar conditions and fibrosis of skin: Secondary | ICD-10-CM | POA: Diagnosis not present

## 2021-03-30 DIAGNOSIS — Z872 Personal history of diseases of the skin and subcutaneous tissue: Secondary | ICD-10-CM | POA: Diagnosis not present

## 2021-03-30 DIAGNOSIS — D225 Melanocytic nevi of trunk: Secondary | ICD-10-CM | POA: Diagnosis not present

## 2021-03-30 DIAGNOSIS — L718 Other rosacea: Secondary | ICD-10-CM | POA: Diagnosis not present

## 2021-03-31 NOTE — Progress Notes (Signed)
Subjective:    Patient ID: Andrea Wells, female    DOB: 1971-08-03, 50 y.o.   MRN: [id number]   This visit occurred during the SARS-CoV-2 public health emergency.  Safety protocols were in place, including screening questions prior to the visit, additional usage of staff PPE, and extensive cleaning of exam room while observing appropriate contact time as indicated for disinfecting solutions.    HPI She is here for a physical exam.   Overall feeling well without major concerns.  Medications and allergies reviewed with patient and updated if appropriate.  Patient Active Problem List   Diagnosis Date Noted   Menorrhagia 10/12/2017   Hyperglycemia 10/12/2017   Vitamin D deficiency 10/12/2017   GERD (gastroesophageal reflux disease) 08/10/2016   Family history of malignant neoplasm of gastrointestinal tract 01/25/2014   Anemia 01/25/2014   Anxiety 06/04/2013    Current Outpatient Medications on File Prior to Visit  Medication Sig Dispense Refill   cetirizine (ZYRTEC) 10 MG tablet Take 1 tablet (10 mg total) by mouth daily. 14 tablet 0   famotidine (PEPCID) 40 MG tablet Take 40 mg by mouth daily.     levonorgestrel (MIRENA) 20 MCG/24HR IUD 1 each by Intrauterine route once.     Multiple Vitamin (MULTIVITAMIN) tablet Take 1 tablet by mouth daily. Reported on 08/12/2015     No current facility-administered medications on file prior to visit.    Past Medical History:  Diagnosis Date   Allergy    GERD (gastroesophageal reflux disease)    Palpitations    Postpartum depression    prev on sertraline for same    Past Surgical History:  Procedure Laterality Date   CESAREAN SECTION  02/2008 & 09/2005   x 2   COLONOSCOPY  last 02/28/2014   w/Dr.Kaplan     Social History   Socioeconomic History   Marital status: Married    Spouse name: Not on file   Number of children: 2   Years of education: Not on file   Highest education level: Not on file  Occupational History    Not on file  Tobacco Use   Smoking status: Never   Smokeless tobacco: Never  Vaping Use   Vaping Use: Never used  Substance and Sexual Activity   Alcohol use: Yes    Alcohol/week: 4.0 standard drinks    Types: 4 Glasses of wine per week    Comment: occasional   Drug use: No   Sexual activity: Not on file  Other Topics Concern   Not on file  Social History Narrative   Married, lives with spouse and 2 kids   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 dx, improved w/ gluten free diet   Prostate cancer Father 78   Colon cancer Father 24   Hypertension Father    Alcohol abuse Other    Diabetes Paternal Aunt    Breast cancer Paternal Aunt 35   Breast cancer Paternal Grandmother    Diabetes Paternal Grandmother    Heart Problems Maternal Grandfather 47  complications of valve repair - died intraop   Heart Problems Maternal Uncle        open heart valve repair, died year after   Colon cancer Paternal Grandfather    Colon polyps Neg Hx    Esophageal cancer Neg Hx    Stomach cancer Neg Hx    Rectal cancer Neg Hx     Review of Systems  Constitutional:  Negative for chills and fever.  Eyes:  Negative for visual disturbance.  Respiratory:  Negative for cough, shortness of breath and wheezing.   Cardiovascular:  Positive for palpitations (occ - with nerves, coffee, no change). Negative for chest pain and leg swelling.  Gastrointestinal:  Negative for abdominal pain, blood in stool, constipation, diarrhea and nausea.  Genitourinary:  Negative for dysuria.  Musculoskeletal:  Negative for arthralgias  and back pain.  Skin:  Negative for rash.  Neurological:  Negative for light-headedness and headaches.  Psychiatric/Behavioral:  Negative for dysphoric mood. The patient is nervous/anxious (mild, controlled).       Objective:   Vitals:   04/01/21 1004  BP: 116/78  Pulse: 69  Temp: 98.3 F (36.8 C)  SpO2: 99%   Filed Weights   04/01/21 1004  Weight: 180 lb (81.6 kg)   Body mass index is 30.9 kg/m.  BP Readings from Last 3 Encounters:  04/01/21 116/78  08/03/19 (!) 103/58  05/18/19 108/78    Wt Readings from Last 3 Encounters:  04/01/21 180 lb (81.6 kg)  08/03/19 169 lb (76.7 kg)  07/18/19 169 lb (76.7 kg)     Physical Exam Constitutional: She appears well-developed and well-nourished. No distress.  HENT:  Head: Normocephalic and atraumatic.  Right Ear: External ear normal. Normal ear canal and TM Left Ear: External ear normal.  Normal ear canal and TM Mouth/Throat: Oropharynx is clear and moist.  Eyes: Conjunctivae and EOM are normal.  Neck: Neck supple. No tracheal deviation present. No thyromegaly present.  No carotid bruit  Cardiovascular: Normal rate, regular rhythm and normal heart sounds.   No murmur heard.  No edema. Pulmonary/Chest: Effort normal and breath sounds normal. No respiratory distress. She has no wheezes. She has no rales.  Breast: deferred   Abdominal: Soft. She exhibits no distension. There is no tenderness.  Lymphadenopathy: She has no cervical adenopathy.  Skin: Skin is warm and dry. She is not diaphoretic.  Psychiatric: She has a normal mood and affect. Her behavior is normal.     Lab Results  Component Value Date   WBC 7.3 05/21/2019   HGB 12.9 05/21/2019   HCT 37.8 05/21/2019   PLT 249.0 05/21/2019   GLUCOSE 89 05/21/2019   CHOL 154 05/21/2019   TRIG 124.0 05/21/2019   HDL 44.00 05/21/2019   LDLDIRECT 150.7 01/16/2013   LDLCALC 85 05/21/2019   ALT 13 05/21/2019   AST 16 05/21/2019   NA 138 05/21/2019   K 4.0 05/21/2019    CL 105 05/21/2019   CREATININE 0.67 05/21/2019   BUN 10 05/21/2019   CO2 26 05/21/2019   TSH 3.21 05/21/2019   HGBA1C 5.6 05/21/2019         Assessment & Plan:   Physical exam: Screening blood work  ordered Exercise  regular Weight  overweight-would like to lose 10 pounds and is working on it Substance abuse  none Saw derm yesterday  Reviewed recommended immunizations.   Health Maintenance  Topic Date Due   Hepatitis C Screening  Never done   PAP SMEAR-Modifier  02/29/2020  MAMMOGRAM  03/01/2020   INFLUENZA VACCINE  03/23/2021   COLONOSCOPY (Pts 45-59yr Insurance coverage will need to be confirmed)  08/02/2024   TETANUS/TDAP  03/01/2028   HIV Screening  Completed   Pneumococcal Vaccine 05566Years old  Aged Out   HPV VACCINES  Aged Out        See Problem List for Assessment and Plan of chronic medical problems.

## 2021-03-31 NOTE — Patient Instructions (Addendum)
Blood work was ordered.     Medications changes include :   none     Please followup in 1 year    Health Maintenance, Female Adopting a healthy lifestyle and getting preventive care are important in promoting health and wellness. Ask your health care provider about: The right schedule for you to have regular tests and exams. Things you can do on your own to prevent diseases and keep yourself healthy. What should I know about diet, weight, and exercise? Eat a healthy diet  Eat a diet that includes plenty of vegetables, fruits, low-fat dairy products, and lean protein. Do not eat a lot of foods that are high in solid fats, added sugars, or sodium.  Maintain a healthy weight Body mass index (BMI) is used to identify weight problems. It estimates body fat based on height and weight. Your health care provider can help determineyour BMI and help you achieve or maintain a healthy weight. Get regular exercise Get regular exercise. This is one of the most important things you can do for your health. Most adults should: Exercise for at least 150 minutes each week. The exercise should increase your heart rate and make you sweat (moderate-intensity exercise). Do strengthening exercises at least twice a week. This is in addition to the moderate-intensity exercise. Spend less time sitting. Even light physical activity can be beneficial. Watch cholesterol and blood lipids Have your blood tested for lipids and cholesterol at 49 years of age, then havethis test every 5 years. Have your cholesterol levels checked more often if: Your lipid or cholesterol levels are high. You are older than 49 years of age. You are at high risk for heart disease. What should I know about cancer screening? Depending on your health history and family history, you may need to have cancer screening at various ages. This may include screening for: Breast cancer. Cervical cancer. Colorectal cancer. Skin cancer. Lung  cancer. What should I know about heart disease, diabetes, and high blood pressure? Blood pressure and heart disease High blood pressure causes heart disease and increases the risk of stroke. This is more likely to develop in people who have high blood pressure readings, are of African descent, or are overweight. Have your blood pressure checked: Every 3-5 years if you are 18-39 years of age. Every year if you are 40 years old or older. Diabetes Have regular diabetes screenings. This checks your fasting blood sugar level. Have the screening done: Once every three years after age 40 if you are at a normal weight and have a low risk for diabetes. More often and at a younger age if you are overweight or have a high risk for diabetes. What should I know about preventing infection? Hepatitis B If you have a higher risk for hepatitis B, you should be screened for this virus. Talk with your health care provider to find out if you are at risk forhepatitis B infection. Hepatitis C Testing is recommended for: Everyone born from 1945 through 1965. Anyone with known risk factors for hepatitis C. Sexually transmitted infections (STIs) Get screened for STIs, including gonorrhea and chlamydia, if: You are sexually active and are younger than 49 years of age. You are older than 49 years of age and your health care provider tells you that you are at risk for this type of infection. Your sexual activity has changed since you were last screened, and you are at increased risk for chlamydia or gonorrhea. Ask your health care provider if you are   at risk. Ask your health care provider about whether you are at high risk for HIV. Your health care provider may recommend a prescription medicine to help prevent HIV infection. If you choose to take medicine to prevent HIV, you should first get tested for HIV. You should then be tested every 3 months for as long as you are taking the medicine. Pregnancy If you are about  to stop having your period (premenopausal) and you may become pregnant, seek counseling before you get pregnant. Take 400 to 800 micrograms (mcg) of folic acid every day if you become pregnant. Ask for birth control (contraception) if you want to prevent pregnancy. Osteoporosis and menopause Osteoporosis is a disease in which the bones lose minerals and strength with aging. This can result in bone fractures. If you are 65 years old or older, or if you are at risk for osteoporosis and fractures, ask your health care provider if you should: Be screened for bone loss. Take a calcium or vitamin D supplement to lower your risk of fractures. Be given hormone replacement therapy (HRT) to treat symptoms of menopause. Follow these instructions at home: Lifestyle Do not use any products that contain nicotine or tobacco, such as cigarettes, e-cigarettes, and chewing tobacco. If you need help quitting, ask your health care provider. Do not use street drugs. Do not share needles. Ask your health care provider for help if you need support or information about quitting drugs. Alcohol use Do not drink alcohol if: Your health care provider tells you not to drink. You are pregnant, may be pregnant, or are planning to become pregnant. If you drink alcohol: Limit how much you use to 0-1 drink a day. Limit intake if you are breastfeeding. Be aware of how much alcohol is in your drink. In the [place] one drink equals one 12 oz bottle of beer (355 mL), one 5 oz glass of wine (148 mL), or one 1 oz glass of hard liquor (44 mL). General instructions Schedule regular health, dental, and eye exams. Stay current with your vaccines. Tell your health care provider if: You often feel depressed. You have ever been abused or do not feel safe at home. Summary Adopting a healthy lifestyle and getting preventive care are important in promoting health and wellness. Follow your health care provider's instructions about  healthy diet, exercising, and getting tested or screened for diseases. Follow your health care provider's instructions on monitoring your cholesterol and blood pressure. This information is not intended to replace advice given to you by your health care provider. Make sure you discuss any questions you have with your healthcare provider. Document Revised: 08/02/2018 Document Reviewed: 08/02/2018 Elsevier Patient Education  2022 [employer]  

## 2021-04-01 ENCOUNTER — Encounter: Payer: Self-pay | Admitting: Internal Medicine

## 2021-04-01 ENCOUNTER — Ambulatory Visit (INDEPENDENT_AMBULATORY_CARE_PROVIDER_SITE_OTHER): Payer: BC Managed Care – PPO | Admitting: Internal Medicine

## 2021-04-01 VITALS — BP 116/78 | HR 69 | Temp 98.3°F | Ht 64.0 in | Wt 180.0 lb

## 2021-04-01 DIAGNOSIS — Z0000 Encounter for general adult medical examination without abnormal findings: Secondary | ICD-10-CM

## 2021-04-01 DIAGNOSIS — Z1159 Encounter for screening for other viral diseases: Secondary | ICD-10-CM | POA: Diagnosis not present

## 2021-04-01 DIAGNOSIS — F419 Anxiety disorder, unspecified: Secondary | ICD-10-CM

## 2021-04-01 DIAGNOSIS — E559 Vitamin D deficiency, unspecified: Secondary | ICD-10-CM

## 2021-04-01 DIAGNOSIS — K219 Gastro-esophageal reflux disease without esophagitis: Secondary | ICD-10-CM

## 2021-04-01 DIAGNOSIS — R739 Hyperglycemia, unspecified: Secondary | ICD-10-CM

## 2021-04-01 LAB — CBC WITH DIFFERENTIAL/PLATELET
Basophils Absolute: 0.1 10*3/uL (ref 0.0–0.1)
Basophils Relative: 0.9 % (ref 0.0–3.0)
Eosinophils Absolute: 0.1 10*3/uL (ref 0.0–0.7)
Eosinophils Relative: 1.8 % (ref 0.0–5.0)
HCT: 41.8 % (ref 36.0–46.0)
Hemoglobin: 14.2 g/dL (ref 12.0–15.0)
Lymphocytes Relative: 23.2 % (ref 12.0–46.0)
Lymphs Abs: 1.4 10*3/uL (ref 0.7–4.0)
MCHC: 34 g/dL (ref 30.0–36.0)
MCV: 91.7 fl (ref 78.0–100.0)
Monocytes Absolute: 0.3 10*3/uL (ref 0.1–1.0)
Monocytes Relative: 5.6 % (ref 3.0–12.0)
Neutro Abs: 4.1 10*3/uL (ref 1.4–7.7)
Neutrophils Relative %: 68.5 % (ref 43.0–77.0)
Platelets: 333 10*3/uL (ref 150.0–400.0)
RBC: 4.56 Mil/uL (ref 3.87–5.11)
RDW: 13.1 % (ref 11.5–15.5)
WBC: 6 10*3/uL (ref 4.0–10.5)

## 2021-04-01 LAB — COMPREHENSIVE METABOLIC PANEL
ALT: 18 U/L (ref 0–35)
AST: 23 U/L (ref 0–37)
Albumin: 4.4 g/dL (ref 3.5–5.2)
Alkaline Phosphatase: 46 U/L (ref 39–117)
BUN: 11 mg/dL (ref 6–23)
CO2: 26 mEq/L (ref 19–32)
Calcium: 9.6 mg/dL (ref 8.4–10.5)
Chloride: 102 mEq/L (ref 96–112)
Creatinine, Ser: 0.69 mg/dL (ref 0.40–1.20)
GFR: 102.11 mL/min (ref >60.00)
Glucose, Bld: 92 mg/dL (ref 70–99)
Potassium: 4.7 mEq/L (ref 3.5–5.1)
Sodium: 137 mEq/L (ref 135–145)
Total Bilirubin: 0.5 mg/dL (ref 0.2–1.2)
Total Protein: 7.9 g/dL (ref 6.0–8.3)

## 2021-04-01 LAB — TSH: TSH: 1.94 u[IU]/mL (ref 0.35–5.50)

## 2021-04-01 LAB — LIPID PANEL
Cholesterol: 232 mg/dL — ABNORMAL HIGH (ref 0–200)
HDL: 47.3 mg/dL (ref >39.00)
LDL Cholesterol: 155 mg/dL — ABNORMAL HIGH (ref 0–99)
NonHDL: 184.71
Total CHOL/HDL Ratio: 5
Triglycerides: 148 mg/dL (ref 0.0–149.0)
VLDL: 29.6 mg/dL (ref 0.0–40.0)

## 2021-04-01 LAB — VITAMIN D 25 HYDROXY (VIT D DEFICIENCY, FRACTURES): VITD: 32.37 ng/mL (ref 30.00–100.00)

## 2021-04-01 LAB — HEMOGLOBIN A1C: Hgb A1c MFr Bld: 5.6 % (ref 4.6–6.5)

## 2021-04-01 MED ORDER — FAMOTIDINE 20 MG PO TABS: 20.0000 mg | ORAL_TABLET | Freq: Two times a day (BID) | ORAL | Status: DC

## 2021-04-01 MED ORDER — OMEPRAZOLE 20 MG PO CPDR: 20.0000 mg | DELAYED_RELEASE_CAPSULE | Freq: Every day | ORAL | 3 refills | Status: DC

## 2021-04-01 NOTE — Assessment & Plan Note (Signed)
Chronic Taking a multivitamin daily Will check vitamin D level

## 2021-04-01 NOTE — Assessment & Plan Note (Signed)
Chronic Check a1c Low sugar / carb diet Stressed regular exercise  

## 2021-04-01 NOTE — Assessment & Plan Note (Signed)
Chronic Very mild She knows she has some anxiety with life stresses She is managing her anxiety with exercise She will let me know if she ever feels like her anxiety is not controlled

## 2021-04-01 NOTE — Assessment & Plan Note (Signed)
Chronic Controlled with OTC Pepcid 20

## 2021-04-02 LAB — HEPATITIS C ANTIBODY
Hepatitis C Ab: NONREACTIVE
SIGNAL TO CUT-OFF: 0.01 (ref <1.00)

## 2021-04-10 DIAGNOSIS — Z1231 Encounter for screening mammogram for malignant neoplasm of breast: Secondary | ICD-10-CM | POA: Diagnosis not present

## 2021-04-10 LAB — HM MAMMOGRAPHY

## 2021-06-06 DIAGNOSIS — J029 Acute pharyngitis, unspecified: Secondary | ICD-10-CM | POA: Diagnosis not present

## 2021-06-15 ENCOUNTER — Encounter: Payer: Self-pay | Admitting: Internal Medicine

## 2021-06-15 NOTE — Progress Notes (Signed)
Outside notes received. Information abstracted. Notes sent to scan.  

## 2021-10-15 DIAGNOSIS — H1031 Unspecified acute conjunctivitis, right eye: Secondary | ICD-10-CM | POA: Diagnosis not present

## 2021-10-28 DIAGNOSIS — Z30431 Encounter for routine checking of intrauterine contraceptive device: Secondary | ICD-10-CM | POA: Diagnosis not present

## 2021-10-28 DIAGNOSIS — Z01419 Encounter for gynecological examination (general) (routine) without abnormal findings: Secondary | ICD-10-CM | POA: Diagnosis not present

## 2021-10-28 DIAGNOSIS — Z130 Encounter for screening for diseases of the blood and blood-forming organs and certain disorders involving the immune mechanism: Secondary | ICD-10-CM | POA: Diagnosis not present

## 2021-10-28 DIAGNOSIS — Z1389 Encounter for screening for other disorder: Secondary | ICD-10-CM | POA: Diagnosis not present

## 2022-04-05 ENCOUNTER — Telehealth: Payer: Self-pay | Admitting: Internal Medicine

## 2022-04-05 NOTE — Telephone Encounter (Signed)
Pt called in requesting order for Diagnostic mammogram with ultrasound if needed. She stated she has an appointment already scheduled with [facility] this Friday 04/09/22. Pt stated that she has been feeling pain and [facility] told her she needed to contact pcp for the mammogram order from PCP.    Please advise

## 2022-04-05 NOTE — Telephone Encounter (Signed)
Lmom advising pt to call our office to schedule appointment to get an order for a diagnostics mammogram. Pt can see any provider.   Fyi

## 2022-04-07 ENCOUNTER — Ambulatory Visit: Payer: BC Managed Care – PPO | Admitting: Family Medicine

## 2022-04-07 ENCOUNTER — Encounter: Payer: Self-pay | Admitting: Family Medicine

## 2022-04-07 VITALS — BP 116/76 | HR 70 | Temp 97.8°F | Ht 64.0 in | Wt 181.0 lb

## 2022-04-07 DIAGNOSIS — N644 Mastodynia: Secondary | ICD-10-CM | POA: Insufficient documentation

## 2022-04-07 DIAGNOSIS — R2232 Localized swelling, mass and lump, left upper limb: Secondary | ICD-10-CM | POA: Diagnosis not present

## 2022-04-07 NOTE — Patient Instructions (Signed)
I have placed orders for a diagnostic mammogram and ultrasound of the left breast and left axilla.

## 2022-04-07 NOTE — Assessment & Plan Note (Signed)
Diagnostic mammogram and left breast US with axillary ordered at [facility] per patient request.

## 2022-04-07 NOTE — Assessment & Plan Note (Signed)
Diagnostic mammogram and left breast US with axillary ordered at [facility] per patient request.

## 2022-04-07 NOTE — Progress Notes (Signed)
Subjective:     Patient ID: Andrea Wells, female    DOB: [birth year], 51 y.o.   MRN: [id number]  Chief Complaint  Patient presents with   Referral    Pain and discomfort in left breast for about a month noted when she called for her screening mammo, after telling them they told her she needs a diagnostic mammo.     HPI Patient is in today for recent left breast discomfort and tenderness. She has noted fullness in her left axilla as well.   Denies fever, chills, dizziness, fatigue, unexplained weight loss, chest pain, palpitations, shortness of breath, abdominal pain, N/V/D.   States she has a mammogram scheduled at [facility] for 04/09/2022 and needs it to be changed to diagnostic with [place] if needed.   Last mammogram 04/10/2021.   Health Maintenance Due  Topic Date Due   PAP SMEAR-Modifier  02/29/2020   Zoster Vaccines- Shingrix (1 of 2) Never done    Past Medical History:  Diagnosis Date   Allergy    GERD (gastroesophageal reflux disease)    Palpitations    Postpartum depression    prev on sertraline for same    Past Surgical History:  Procedure Laterality Date   CESAREAN SECTION  02/2008 & 09/2005   x 2   COLONOSCOPY  last 02/28/2014   w/Dr.Kaplan     Family History  Problem Relation Age of Onset   Crohn's disease Mother        ? dx, improved w/ gluten free diet   Prostate cancer Father 27   Colon cancer Father 31   Hypertension Father    Alcohol abuse Other    Diabetes Paternal Aunt    Breast cancer Paternal Aunt 64   Breast cancer Paternal Grandmother    Diabetes Paternal Grandmother    Heart Problems Maternal Grandfather 64       complications of valve repair - died intraop   Heart Problems Maternal Uncle        open heart valve repair, died year after   Colon cancer Paternal Grandfather    Colon polyps Neg Hx    Esophageal cancer Neg Hx    Stomach cancer Neg Hx    Rectal cancer Neg Hx     Social History   Socioeconomic History   Marital status:  Married    Spouse name: Not on file   Number of children: 2   Years of education: Not on file   Highest education level: Not on file  Occupational History   Not on file  Tobacco Use   Smoking status: Never   Smokeless tobacco: Never  Vaping Use   Vaping Use: Never used  Substance and Sexual Activity   Alcohol use: Yes    Alcohol/week: 4.0 standard drinks of alcohol    Types: 4 Glasses of wine per week    Comment: occasional   Drug use: No   Sexual activity: Not on file  Other Topics Concern   Not on file  Social History Narrative   Married, lives with spouse and 2 kids   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     levonorgestrel (MIRENA) 20 MCG/24HR IUD 1 each by Intrauterine route once.     Multiple Vitamin (MULTIVITAMIN) tablet Take 1 tablet by mouth daily. Reported on 08/12/2015     Probiotic Product (PROBIOTIC DAILY PO) Take by mouth.     cetirizine (ZYRTEC) 10 MG tablet Take 1 tablet (10 mg total) by mouth daily. 14 tablet 0   No facility-administered medications prior to visit.    Allergies  Allergen Reactions   Penicillins Hives   Sulfonamide Derivatives Hives    ROS     Objective:    Physical Exam Exam conducted with a chaperone present.  Constitutional:      General: She is not in acute distress.    Appearance: She is not ill-appearing.  Cardiovascular:     Rate and Rhythm: Normal rate.  Pulmonary:     Effort: Pulmonary effort is normal.  Chest:   Breasts:    Right: Normal.     Left: Tenderness present.     Comments: Left sided breast tenderness. Left axillary fullness No discreet palpable masses. No skin or nipple abnormalities  Lymphadenopathy:     Upper Body:     Right upper body: No supraclavicular or axillary adenopathy.     Left upper body: No supraclavicular or axillary adenopathy.  Skin:    General: Skin is warm and dry.  Neurological:     General: No focal deficit present.     Mental Status: She is alert and oriented to person, place, and time.     BP 116/76 (BP Location: Left Arm, Patient Position: Sitting, Cuff Size: Large)   Pulse 70   Temp 97.8 F (36.6 C) (Temporal)   Ht '5\' 4"'$  (1.626 m)   Wt 181 lb (82.1 kg)   SpO2 99%   BMI 31.07 kg/m  Wt Readings from Last 3 Encounters:  04/07/22 181 lb (82.1 kg)  04/01/21 180 lb (81.6 kg)  08/03/19 169 lb (76.7 kg)       Assessment & Plan:   Problem List Items Addressed This Visit       Other   Left axillary fullness    Diagnostic mammogram and left breast US with axillary ordered at [facility] per patient request.       Relevant Orders   MM Digital Diagnostic Bilat   US BREAST COMPLETE UNI LEFT INC AXILLA   Pain of left breast - Primary    Diagnostic mammogram and left breast US with axillary ordered at [facility] per patient request.       Relevant Orders   MM Digital Diagnostic Bilat   US BREAST COMPLETE UNI LEFT INC AXILLA    I have discontinued Iyonna Cardell's cetirizine. I am also having her maintain her multivitamin, levonorgestrel, famotidine, and Probiotic Product (PROBIOTIC DAILY PO).  No orders of the defined types were placed in this encounter.

## 2022-04-09 DIAGNOSIS — N644 Mastodynia: Secondary | ICD-10-CM | POA: Diagnosis not present

## 2022-04-09 LAB — HM MAMMOGRAPHY

## 2022-04-15 ENCOUNTER — Encounter: Payer: Self-pay | Admitting: Internal Medicine

## 2022-04-15 NOTE — Progress Notes (Signed)
Outside notes received. Information abstracted. Notes sent to scan.  

## 2022-05-19 DIAGNOSIS — L814 Other melanin hyperpigmentation: Secondary | ICD-10-CM | POA: Diagnosis not present

## 2022-05-19 DIAGNOSIS — L821 Other seborrheic keratosis: Secondary | ICD-10-CM | POA: Diagnosis not present

## 2022-05-19 DIAGNOSIS — D225 Melanocytic nevi of trunk: Secondary | ICD-10-CM | POA: Diagnosis not present

## 2022-05-20 DIAGNOSIS — H15122 Nodular episcleritis, left eye: Secondary | ICD-10-CM | POA: Diagnosis not present

## 2022-05-27 DIAGNOSIS — H15122 Nodular episcleritis, left eye: Secondary | ICD-10-CM | POA: Diagnosis not present

## 2022-10-20 ENCOUNTER — Other Ambulatory Visit (INDEPENDENT_AMBULATORY_CARE_PROVIDER_SITE_OTHER): Payer: BC Managed Care – PPO

## 2022-10-20 ENCOUNTER — Telehealth: Payer: Self-pay | Admitting: Internal Medicine

## 2022-10-20 DIAGNOSIS — Z0000 Encounter for general adult medical examination without abnormal findings: Secondary | ICD-10-CM

## 2022-10-20 DIAGNOSIS — D649 Anemia, unspecified: Secondary | ICD-10-CM | POA: Diagnosis not present

## 2022-10-20 DIAGNOSIS — E782 Mixed hyperlipidemia: Secondary | ICD-10-CM

## 2022-10-20 DIAGNOSIS — R739 Hyperglycemia, unspecified: Secondary | ICD-10-CM | POA: Diagnosis not present

## 2022-10-20 DIAGNOSIS — E559 Vitamin D deficiency, unspecified: Secondary | ICD-10-CM | POA: Diagnosis not present

## 2022-10-20 LAB — CBC WITH DIFFERENTIAL/PLATELET
Basophils Absolute: 0 10*3/uL (ref 0.0–0.1)
Basophils Relative: 0.5 % (ref 0.0–3.0)
Eosinophils Absolute: 0.1 10*3/uL (ref 0.0–0.7)
Eosinophils Relative: 1.2 % (ref 0.0–5.0)
HCT: 38.8 % (ref 36.0–46.0)
Hemoglobin: 13.2 g/dL (ref 12.0–15.0)
Lymphocytes Relative: 29.6 % (ref 12.0–46.0)
Lymphs Abs: 2.7 10*3/uL (ref 0.7–4.0)
MCHC: 33.9 g/dL (ref 30.0–36.0)
MCV: 89.8 fl (ref 78.0–100.0)
Monocytes Absolute: 0.6 10*3/uL (ref 0.1–1.0)
Monocytes Relative: 6.8 % (ref 3.0–12.0)
Neutro Abs: 5.6 10*3/uL (ref 1.4–7.7)
Neutrophils Relative %: 61.9 % (ref 43.0–77.0)
Platelets: 354 10*3/uL (ref 150.0–400.0)
RBC: 4.32 Mil/uL (ref 3.87–5.11)
RDW: 12.6 % (ref 11.5–15.5)
WBC: 9.1 10*3/uL (ref 4.0–10.5)

## 2022-10-20 LAB — COMPREHENSIVE METABOLIC PANEL
ALT: 21 U/L (ref 0–35)
AST: 23 U/L (ref 0–37)
Albumin: 4.1 g/dL (ref 3.5–5.2)
Alkaline Phosphatase: 45 U/L (ref 39–117)
BUN: 18 mg/dL (ref 6–23)
CO2: 28 mEq/L (ref 19–32)
Calcium: 9.7 mg/dL (ref 8.4–10.5)
Chloride: 102 mEq/L (ref 96–112)
Creatinine, Ser: 0.64 mg/dL (ref 0.40–1.20)
GFR: 102.86 mL/min (ref >60.00)
Glucose, Bld: 83 mg/dL (ref 70–99)
Potassium: 4.2 mEq/L (ref 3.5–5.1)
Sodium: 137 mEq/L (ref 135–145)
Total Bilirubin: 0.3 mg/dL (ref 0.2–1.2)
Total Protein: 7.5 g/dL (ref 6.0–8.3)

## 2022-10-20 LAB — VITAMIN D 25 HYDROXY (VIT D DEFICIENCY, FRACTURES): VITD: 27.56 ng/mL — ABNORMAL LOW (ref 30.00–100.00)

## 2022-10-20 LAB — HEMOGLOBIN A1C: Hgb A1c MFr Bld: 6 % (ref 4.6–6.5)

## 2022-10-20 LAB — LIPID PANEL
Cholesterol: 195 mg/dL (ref 0–200)
HDL: 42.9 mg/dL (ref >39.00)
NonHDL: 152.35
Total CHOL/HDL Ratio: 5
Triglycerides: 252 mg/dL — ABNORMAL HIGH (ref 0.0–149.0)
VLDL: 50.4 mg/dL — ABNORMAL HIGH (ref 0.0–40.0)

## 2022-10-20 LAB — TSH: TSH: 2.08 u[IU]/mL (ref 0.35–5.50)

## 2022-10-20 LAB — LDL CHOLESTEROL, DIRECT: Direct LDL: 136 mg/dL

## 2022-10-20 NOTE — Telephone Encounter (Signed)
Blood work ordered.

## 2022-10-20 NOTE — Telephone Encounter (Signed)
Left message for patient to return call to clinic to scheduled lab appointment.  Please schedule if she returns phone call.

## 2022-10-20 NOTE — Telephone Encounter (Signed)
Patient has physical scheduled for next week.  She would like her labs ordered so she can come in this week to have them drawn.  Please call patient and advise.  Patient's number:  [phone]

## 2022-10-24 ENCOUNTER — Encounter: Payer: Self-pay | Admitting: Internal Medicine

## 2022-10-24 NOTE — Patient Instructions (Addendum)
Have blood work done at some point in the next few months to recheck your cholesterol and sugars    Medications changes include :   none     Return in about 1 year (around 10/25/2023) for Physical Exam.    Health Maintenance, Female Adopting a healthy lifestyle and getting preventive care are important in promoting health and wellness. Ask your health care provider about: The right schedule for you to have regular tests and exams. Things you can do on your own to prevent diseases and keep yourself healthy. What should I know about diet, weight, and exercise? Eat a healthy diet  Eat a diet that includes plenty of vegetables, fruits, low-fat dairy products, and lean protein. Do not eat a lot of foods that are high in solid fats, added sugars, or sodium. Maintain a healthy weight Body mass index (BMI) is used to identify weight problems. It estimates body fat based on height and weight. Your health care provider can help determine your BMI and help you achieve or maintain a healthy weight. Get regular exercise Get regular exercise. This is one of the most important things you can do for your health. Most adults should: Exercise for at least 150 minutes each week. The exercise should increase your heart rate and make you sweat (moderate-intensity exercise). Do strengthening exercises at least twice a week. This is in addition to the moderate-intensity exercise. Spend less time sitting. Even light physical activity can be beneficial. Watch cholesterol and blood lipids Have your blood tested for lipids and cholesterol at 51 years of age, then have this test every 5 years. Have your cholesterol levels checked more often if: Your lipid or cholesterol levels are high. You are older than 51 years of age. You are at high risk for heart disease. What should I know about cancer screening? Depending on your health history and family history, you may need to have cancer screening at various  ages. This may include screening for: Breast cancer. Cervical cancer. Colorectal cancer. Skin cancer. Lung cancer. What should I know about heart disease, diabetes, and high blood pressure? Blood pressure and heart disease High blood pressure causes heart disease and increases the risk of stroke. This is more likely to develop in people who have high blood pressure readings or are overweight. Have your blood pressure checked: Every 3-5 years if you are 44-80 years of age. Every year if you are 52 years old or older. Diabetes Have regular diabetes screenings. This checks your fasting blood sugar level. Have the screening done: Once every three years after age 24 if you are at a normal weight and have a low risk for diabetes. More often and at a younger age if you are overweight or have a high risk for diabetes. What should I know about preventing infection? Hepatitis B If you have a higher risk for hepatitis B, you should be screened for this virus. Talk with your health care provider to find out if you are at risk for hepatitis B infection. Hepatitis C Testing is recommended for: Everyone born from [birth year] through 1964. Anyone with known risk factors for hepatitis C. Sexually transmitted infections (STIs) Get screened for STIs, including gonorrhea and chlamydia, if: You are sexually active and are younger than 51 years of age. You are older than 51 years of age and your health care provider tells you that you are at risk for this type of infection. Your sexual activity has changed since you were last screened,  and you are at increased risk for chlamydia or gonorrhea. Ask your health care provider if you are at risk. Ask your health care provider about whether you are at high risk for HIV. Your health care provider may recommend a prescription medicine to help prevent HIV infection. If you choose to take medicine to prevent HIV, you should first get tested for HIV. You should then be tested  every 3 months for as long as you are taking the medicine. Pregnancy If you are about to stop having your period (premenopausal) and you may become pregnant, seek counseling before you get pregnant. Take 400 to 800 micrograms (mcg) of folic acid every day if you become pregnant. Ask for birth control (contraception) if you want to prevent pregnancy. Osteoporosis and menopause Osteoporosis is a disease in which the bones lose minerals and strength with aging. This can result in bone fractures. If you are 59 years old or older, or if you are at risk for osteoporosis and fractures, ask your health care provider if you should: Be screened for bone loss. Take a calcium or vitamin D supplement to lower your risk of fractures. Be given hormone replacement therapy (HRT) to treat symptoms of menopause. Follow these instructions at home: Alcohol use Do not drink alcohol if: Your health care provider tells you not to drink. You are pregnant, may be pregnant, or are planning to become pregnant. If you drink alcohol: Limit how much you have to: 0-1 drink a day. Know how much alcohol is in your drink. In the [place] one drink equals one 12 oz bottle of beer (355 mL), one 5 oz glass of wine (148 mL), or one 1 oz glass of hard liquor (44 mL). Lifestyle Do not use any products that contain nicotine or tobacco. These products include cigarettes, chewing tobacco, and vaping devices, such as e-cigarettes. If you need help quitting, ask your health care provider. Do not use street drugs. Do not share needles. Ask your health care provider for help if you need support or information about quitting drugs. General instructions Schedule regular health, dental, and eye exams. Stay current with your vaccines. Tell your health care provider if: You often feel depressed. You have ever been abused or do not feel safe at home. Summary Adopting a healthy lifestyle and getting preventive care are important in promoting  health and wellness. Follow your health care provider's instructions about healthy diet, exercising, and getting tested or screened for diseases. Follow your health care provider's instructions on monitoring your cholesterol and blood pressure. This information is not intended to replace advice given to you by your health care provider. Make sure you discuss any questions you have with your health care provider. Document Revised: 12/29/2020 Document Reviewed: 12/29/2020 Elsevier Patient Education  Congress.

## 2022-10-24 NOTE — Progress Notes (Unsigned)
Subjective:    Patient ID: Andrea Wells, female    DOB: 04-12-1971, 52 y.o.   MRN: [id number]      HPI Rayvon is here for a Physical exam.   She had her blood work done already.    Medications and allergies reviewed with patient and updated if appropriate.  Current Outpatient Medications on File Prior to Visit  Medication Sig Dispense Refill   famotidine (PEPCID) 20 MG tablet Take 1 tablet (20 mg total) by mouth 2 (two) times daily.     levonorgestrel (MIRENA) 20 MCG/24HR IUD 1 each by Intrauterine route once.     Multiple Vitamin (MULTIVITAMIN) tablet Take 1 tablet by mouth daily. Reported on 08/12/2015     Probiotic Product (PROBIOTIC DAILY PO) Take by mouth.     No current facility-administered medications on file prior to visit.    Review of Systems     Objective:  There were no vitals filed for this visit. There were no vitals filed for this visit. There is no height or weight on file to calculate BMI.  BP Readings from Last 3 Encounters:  04/07/22 116/76  04/01/21 116/78  08/03/19 (!) 103/58    Wt Readings from Last 3 Encounters:  04/07/22 181 lb (82.1 kg)  04/01/21 180 lb (81.6 kg)  08/03/19 169 lb (76.7 kg)       Physical Exam Constitutional: She appears well-developed and well-nourished. No distress.  HENT:  Head: Normocephalic and atraumatic.  Right Ear: External ear normal. Normal ear canal and TM Left Ear: External ear normal.  Normal ear canal and TM Mouth/Throat: Oropharynx is clear and moist.  Eyes: Conjunctivae normal.  Neck: Neck supple. No tracheal deviation present. No thyromegaly present.  No carotid bruit  Cardiovascular: Normal rate, regular rhythm and normal heart sounds.   No murmur heard.  No edema. Pulmonary/Chest: Effort normal and breath sounds normal. No respiratory distress. She has no wheezes. She has no rales.  Breast: deferred   Abdominal: Soft. She exhibits no distension. There is no tenderness.  Lymphadenopathy: She  has no cervical adenopathy.  Skin: Skin is warm and dry. She is not diaphoretic.  Psychiatric: She has a normal mood and affect. Her behavior is normal.     Lab Results  Component Value Date   WBC 9.1 10/20/2022   HGB 13.2 10/20/2022   HCT 38.8 10/20/2022   PLT 354.0 10/20/2022   GLUCOSE 83 10/20/2022   CHOL 195 10/20/2022   TRIG 252.0 (H) 10/20/2022   HDL 42.90 10/20/2022   LDLDIRECT 136.0 10/20/2022   LDLCALC 155 (H) 04/01/2021   ALT 21 10/20/2022   AST 23 10/20/2022   NA 137 10/20/2022   K 4.2 10/20/2022   CL 102 10/20/2022   CREATININE 0.64 10/20/2022   BUN 18 10/20/2022   CO2 28 10/20/2022   TSH 2.08 10/20/2022   HGBA1C 6.0 10/20/2022         Assessment & Plan:   Physical exam: Screening blood work  done prior visit - reviewed Exercise   Weight   Substance abuse  none   Reviewed recommended immunizations.   Health Maintenance  Topic Date Due   COVID-19 Vaccine (1) Never done   PAP SMEAR-Modifier  02/29/2020   Zoster Vaccines- Shingrix (1 of 2) Never done   INFLUENZA VACCINE  03/23/2022   MAMMOGRAM  04/09/2024   COLONOSCOPY (Pts 45-36yr Insurance coverage will need to be confirmed)  08/02/2024   DTaP/Tdap/Td (3 - Td or Tdap) 03/01/2028  Hepatitis C Screening  Completed   HIV Screening  Completed   HPV VACCINES  Aged Out          See Problem List for Assessment and Plan of chronic medical problems.

## 2022-10-25 ENCOUNTER — Encounter: Payer: Self-pay | Admitting: Internal Medicine

## 2022-10-25 ENCOUNTER — Ambulatory Visit (INDEPENDENT_AMBULATORY_CARE_PROVIDER_SITE_OTHER): Payer: BC Managed Care – PPO | Admitting: Internal Medicine

## 2022-10-25 VITALS — BP 118/72 | HR 63 | Temp 98.4°F | Ht 64.0 in | Wt 187.0 lb

## 2022-10-25 DIAGNOSIS — R7303 Prediabetes: Secondary | ICD-10-CM

## 2022-10-25 DIAGNOSIS — K219 Gastro-esophageal reflux disease without esophagitis: Secondary | ICD-10-CM

## 2022-10-25 DIAGNOSIS — E559 Vitamin D deficiency, unspecified: Secondary | ICD-10-CM

## 2022-10-25 DIAGNOSIS — E782 Mixed hyperlipidemia: Secondary | ICD-10-CM | POA: Diagnosis not present

## 2022-10-25 DIAGNOSIS — R739 Hyperglycemia, unspecified: Secondary | ICD-10-CM

## 2022-10-25 DIAGNOSIS — F419 Anxiety disorder, unspecified: Secondary | ICD-10-CM

## 2022-10-25 DIAGNOSIS — Z0000 Encounter for general adult medical examination without abnormal findings: Secondary | ICD-10-CM

## 2022-10-25 MED ORDER — FAMOTIDINE 20 MG PO TABS: 20.0000 mg | ORAL_TABLET | Freq: Every day | ORAL | Status: AC

## 2022-10-25 NOTE — Assessment & Plan Note (Signed)
Increased anxiety recently-life anxieties May have some slight very mild depression Deferred medication She is thinking about seeing a therapist Continue regular exercise

## 2022-10-25 NOTE — Assessment & Plan Note (Signed)
Vitamin D level slightly low She is taking a multivitamin Advise adding vitamin D to her regimen

## 2022-10-25 NOTE — Assessment & Plan Note (Signed)
Chronic Recent LDL improved, but triglycerides are elevated Will repeat lipid panel in 6-8 weeks-she will work on changes to see if that helps Continue exercise, work on weight loss

## 2022-10-25 NOTE — Assessment & Plan Note (Signed)
Chronic Controlled Taking pepcid 20 mg daily

## 2022-10-25 NOTE — Assessment & Plan Note (Signed)
Chronic A1c 6.0% Discussed that she is at risk for developing diabetes Decrease carbs Continue regular exercise, weight loss Recheck A1c in 6-8 weeks

## 2022-11-01 DIAGNOSIS — Z01419 Encounter for gynecological examination (general) (routine) without abnormal findings: Secondary | ICD-10-CM | POA: Diagnosis not present

## 2022-11-01 DIAGNOSIS — Z1389 Encounter for screening for other disorder: Secondary | ICD-10-CM | POA: Diagnosis not present

## 2022-11-01 DIAGNOSIS — Z130 Encounter for screening for diseases of the blood and blood-forming organs and certain disorders involving the immune mechanism: Secondary | ICD-10-CM | POA: Diagnosis not present

## 2022-11-04 ENCOUNTER — Encounter: Payer: Self-pay | Admitting: Family Medicine

## 2022-11-04 ENCOUNTER — Ambulatory Visit (INDEPENDENT_AMBULATORY_CARE_PROVIDER_SITE_OTHER): Payer: BC Managed Care – PPO | Admitting: Family Medicine

## 2022-11-04 VITALS — BP 112/76 | HR 67 | Temp 97.6°F | Ht 64.0 in | Wt 185.0 lb

## 2022-11-04 DIAGNOSIS — R229 Localized swelling, mass and lump, unspecified: Secondary | ICD-10-CM

## 2022-11-04 NOTE — Progress Notes (Signed)
Subjective:     Patient ID: Andrea Wells, female    DOB: 1971/07/21, 52 y.o.   MRN: [id number]  Chief Complaint  Patient presents with   Mass    Under c-section scar (happened 14 yrs ago) noticed a hard bump a week ago. Denies any pain.     HPI Patient is in today for a firm, non tender area beneath the edge of her C-section scar. She noticed this while showering.    Health Maintenance Due  Topic Date Due   Zoster Vaccines- Shingrix (1 of 2) Never done   PAP SMEAR-Modifier  02/28/2022    Past Medical History:  Diagnosis Date   Allergy    GERD (gastroesophageal reflux disease)    Palpitations    Postpartum depression    prev on sertraline for same    Past Surgical History:  Procedure Laterality Date   CESAREAN SECTION  02/2008 & 09/2005   x 2   COLONOSCOPY  last 02/28/2014   w/Dr.Kaplan     Family History  Problem Relation Age of Onset   Crohn's disease Mother        ? dx, improved w/ gluten free diet   Prostate cancer Father 29   Colon cancer Father 65   Hypertension Father    Alcohol abuse Other    Diabetes Paternal Aunt    Breast cancer Paternal Aunt 72   Breast cancer Paternal Grandmother    Diabetes Paternal Grandmother    Heart Problems Maternal Grandfather 59       complications of valve repair - died intraop   Heart Problems Maternal Uncle        open heart valve repair, died year after   Colon cancer Paternal Grandfather    Colon polyps Neg Hx    Esophageal cancer Neg Hx    Stomach cancer Neg Hx    Rectal cancer Neg Hx     Social History   Socioeconomic History   Marital status: Married    Spouse name: Not on file   Number of children: 2   Years of education: Not on file   Highest education level: Not on file  Occupational History   Not on file  Tobacco Use   Smoking status: Never   Smokeless tobacco: Never  Vaping Use   Vaping Use: Never used  Substance and Sexual Activity   Alcohol use: Yes    Alcohol/week: 4.0 standard drinks  of alcohol    Types: 4 Glasses of wine per week    Comment: occasional   Drug use: No   Sexual activity: Not on file  Other Topics Concern   Not on file  Social History Narrative   Married, lives with spouse and 2 kids   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     levonorgestrel (MIRENA) 20 MCG/24HR IUD 1 each by Intrauterine route once.     Multiple Vitamin (MULTIVITAMIN) tablet Take 1 tablet by mouth  daily. Reported on 08/12/2015     Probiotic Product (PROBIOTIC DAILY PO) Take by mouth.     No facility-administered medications prior to visit.    Allergies  Allergen Reactions   Penicillins Hives and Other (See Comments)   Sulfonamide Derivatives Hives and Other (See Comments)    ROS     Objective:    Physical Exam Constitutional:      General: She is not in acute distress.    Appearance: She is not ill-appearing.  Cardiovascular:     Rate and Rhythm: Normal rate.  Pulmonary:     Effort: Pulmonary effort is normal.  Abdominal:     Palpations: Abdomen is soft.     Tenderness: There is no abdominal tenderness.     Comments: Superficial firm area beneath the lateral end of scar, non tender. No erythema.   Neurological:     Mental Status: She is alert.     BP 112/76 (BP Location: Left Arm, Patient Position: Sitting, Cuff Size: Large)   Pulse 67   Temp 97.6 F (36.4 C) (Temporal)   Ht '5\' 4"'$  (1.626 m)   Wt 185 lb (83.9 kg)   SpO2 99%   BMI 31.76 kg/m  Wt Readings from Last  3 Encounters:  11/04/22 185 lb (83.9 kg)  10/25/22 187 lb (84.8 kg)  04/07/22 181 lb (82.1 kg)       Assessment & Plan:   Problem List Items Addressed This Visit   None Visit Diagnoses     Localized skin mass, lump, or swelling    -  Primary      Discussed this is most likely scar tissue. She will continue to monitor and follow up if she notices any changes.   I am having Evelise Axton maintain her multivitamin, levonorgestrel, Probiotic Product (PROBIOTIC DAILY PO), and famotidine.  No orders of the defined types were placed in this encounter.

## 2022-11-04 NOTE — Patient Instructions (Signed)
Monitor the area and let us know if you develop any skin changes, or if the area is growing or becoming more painful.   Apply heat to the area, this may help to shrink the firm tissue

## 2022-11-08 ENCOUNTER — Ambulatory Visit: Payer: BC Managed Care – PPO | Admitting: Internal Medicine

## 2023-01-26 ENCOUNTER — Other Ambulatory Visit (INDEPENDENT_AMBULATORY_CARE_PROVIDER_SITE_OTHER): Payer: BC Managed Care – PPO

## 2023-01-26 DIAGNOSIS — E782 Mixed hyperlipidemia: Secondary | ICD-10-CM | POA: Diagnosis not present

## 2023-01-26 DIAGNOSIS — R7303 Prediabetes: Secondary | ICD-10-CM

## 2023-01-26 LAB — LIPID PANEL
Cholesterol: 193 mg/dL (ref 0–200)
HDL: 44.7 mg/dL (ref >39.00)
LDL Cholesterol: 132 mg/dL — ABNORMAL HIGH (ref 0–99)
NonHDL: 148.25
Total CHOL/HDL Ratio: 4
Triglycerides: 79 mg/dL (ref 0.0–149.0)
VLDL: 15.8 mg/dL (ref 0.0–40.0)

## 2023-01-26 LAB — HEMOGLOBIN A1C: Hgb A1c MFr Bld: 5.5 % (ref 4.6–6.5)

## 2023-01-27 ENCOUNTER — Other Ambulatory Visit: Payer: BC Managed Care – PPO

## 2023-04-12 DIAGNOSIS — Z1231 Encounter for screening mammogram for malignant neoplasm of breast: Secondary | ICD-10-CM | POA: Diagnosis not present

## 2023-04-12 LAB — HM MAMMOGRAPHY

## 2023-07-19 DIAGNOSIS — D485 Neoplasm of uncertain behavior of skin: Secondary | ICD-10-CM | POA: Diagnosis not present

## 2023-07-19 DIAGNOSIS — D239 Other benign neoplasm of skin, unspecified: Secondary | ICD-10-CM | POA: Diagnosis not present

## 2023-07-19 DIAGNOSIS — D2239 Melanocytic nevi of other parts of face: Secondary | ICD-10-CM | POA: Diagnosis not present

## 2023-11-02 DIAGNOSIS — Z01419 Encounter for gynecological examination (general) (routine) without abnormal findings: Secondary | ICD-10-CM | POA: Diagnosis not present

## 2023-11-24 ENCOUNTER — Telehealth: Payer: Self-pay | Admitting: Internal Medicine

## 2023-11-24 DIAGNOSIS — E782 Mixed hyperlipidemia: Secondary | ICD-10-CM

## 2023-11-24 DIAGNOSIS — R7303 Prediabetes: Secondary | ICD-10-CM

## 2023-11-24 DIAGNOSIS — D649 Anemia, unspecified: Secondary | ICD-10-CM

## 2023-11-24 DIAGNOSIS — E559 Vitamin D deficiency, unspecified: Secondary | ICD-10-CM

## 2023-11-24 NOTE — Telephone Encounter (Signed)
 Copied from CRM [id number]. Topic: General - Other >> Nov 24, 2023  1:08 PM Denese Killings wrote: Reason for CRM: Patient wants to know if she can come in to do her labs the morning of her physical and if an order can be put in for it beforehand.  ---  Please add labs for pt

## 2023-11-25 ENCOUNTER — Other Ambulatory Visit: Payer: Self-pay

## 2023-11-25 DIAGNOSIS — E782 Mixed hyperlipidemia: Secondary | ICD-10-CM

## 2023-11-25 DIAGNOSIS — E559 Vitamin D deficiency, unspecified: Secondary | ICD-10-CM

## 2023-11-25 DIAGNOSIS — R7303 Prediabetes: Secondary | ICD-10-CM

## 2023-11-25 DIAGNOSIS — D649 Anemia, unspecified: Secondary | ICD-10-CM

## 2023-11-26 NOTE — Telephone Encounter (Signed)
 Blood work ordered.

## 2023-11-28 ENCOUNTER — Encounter: Payer: Self-pay | Admitting: Internal Medicine

## 2023-11-28 NOTE — Progress Notes (Unsigned)
 Subjective:    Patient ID: Andrea Wells, female    DOB: [birth year], 53 y.o.   MRN: [id number]      HPI Andrea Wells is here for a Physical exam and her chronic medical problems.    Had labs done.   Son is graduating from HS this year - deciding on school for college.  Has some increased anxiety - mood swings.   She is perimenopausal.  She has increased irritability and constantly on edge.    Medications and allergies reviewed with patient and updated if appropriate.  Current Outpatient Medications on File Prior to Visit  Medication Sig Dispense Refill   famotidine (PEPCID) 20 MG tablet Take 1 tablet (20 mg total) by mouth at bedtime.     levonorgestrel (MIRENA) 20 MCG/24HR IUD 1 each by Intrauterine route once.     Multiple Vitamin (MULTIVITAMIN) tablet Take 1 tablet by mouth daily. Reported on 08/12/2015     PARoxetine (PAXIL) 10 MG tablet Take 1 tablet by mouth daily.     Probiotic Product (PROBIOTIC DAILY PO) Take by mouth.     No current facility-administered medications on file prior to visit.    Review of Systems  Constitutional:  Negative for fever.  Eyes:  Negative for visual disturbance.  Respiratory:  Negative for cough, shortness of breath and wheezing.   Cardiovascular:  Positive for palpitations (stress related). Negative for chest pain and leg swelling.  Gastrointestinal:  Positive for constipation. Negative for abdominal pain, blood in stool and diarrhea.       No gerd  Genitourinary:  Negative for dysuria.  Musculoskeletal:  Positive for arthralgias (minimal knees). Negative for back pain.  Skin:  Negative for rash.  Neurological:  Negative for light-headedness and headaches.  Psychiatric/Behavioral:  Negative for dysphoric mood. The patient is nervous/anxious.        Objective:   Vitals:   11/29/23 1301  BP: 116/72  Pulse: (!) 59  Temp: 98.3 F (36.8 C)  SpO2: 99%   Filed Weights   11/29/23 1301  Weight: 166 lb (75.3 kg)   Body mass index is  28.49 kg/m.  BP Readings from Last 3 Encounters:  11/29/23 116/72  11/04/22 112/76  10/25/22 118/72    Wt Readings from Last 3 Encounters:  11/29/23 166 lb (75.3 kg)  11/04/22 185 lb (83.9 kg)  10/25/22 187 lb (84.8 kg)       Physical Exam Constitutional: She appears well-developed and well-nourished. No distress.  HENT:  Head: Normocephalic and atraumatic.  Right Ear: External ear normal. Normal ear canal and TM Left Ear: External ear normal.  Normal ear canal and TM Mouth/Throat: Oropharynx is clear and moist.  Eyes: Conjunctivae normal.  Neck: Neck supple. No tracheal deviation present. No thyromegaly present.  No carotid bruit  Cardiovascular: Normal rate, regular rhythm and normal heart sounds.   No murmur heard.  No edema. Pulmonary/Chest: Effort normal and breath sounds normal. No respiratory distress. She has no wheezes. She has no rales.  Breast: deferred   Abdominal: Soft. She exhibits no distension. There is no tenderness.  Lymphadenopathy: She has no cervical adenopathy.  Skin: Skin is warm and dry. She is not diaphoretic.  Psychiatric: She has a normal mood and affect. Her behavior is normal.     Lab Results  Component Value Date   WBC 6.8 11/29/2023   HGB 12.9 11/29/2023   HCT 37.9 11/29/2023   PLT 255.0 11/29/2023   GLUCOSE 90 11/29/2023   CHOL 177 11/29/2023  TRIG 89.0 11/29/2023   HDL 45.70 11/29/2023   LDLDIRECT 136.0 10/20/2022   LDLCALC 113 (H) 11/29/2023   ALT 13 11/29/2023   AST 17 11/29/2023   NA 138 11/29/2023   K 4.1 11/29/2023   CL 105 11/29/2023   CREATININE 0.51 11/29/2023   BUN 11 11/29/2023   CO2 24 11/29/2023   TSH 2.11 11/29/2023   HGBA1C 5.4 11/29/2023         Assessment & Plan:   Physical exam: Screening blood work  reviewed Exercise  regular Weight  overweight - lost weight over the past two years Substance abuse  none   Reviewed recommended immunizations.   Health Maintenance  Topic Date Due   Cervical  Cancer Screening (HPV/Pap Cotest)  02/28/2022   COVID-19 Vaccine (6 - 2024-25 season) 12/15/2023 (Originally 04/24/2023)   INFLUENZA VACCINE  03/23/2024   Colonoscopy  08/02/2024   MAMMOGRAM  04/11/2025   DTaP/Tdap/Td (3 - Td or Tdap) 03/01/2028   Hepatitis C Screening  Completed   HIV Screening  Completed   HPV VACCINES  Aged Out   Zoster Vaccines- Shingrix  Discontinued          See Problem List for Assessment and Plan of chronic medical problems.

## 2023-11-28 NOTE — Patient Instructions (Addendum)

## 2023-11-29 ENCOUNTER — Ambulatory Visit (INDEPENDENT_AMBULATORY_CARE_PROVIDER_SITE_OTHER): Payer: BC Managed Care – PPO | Admitting: Internal Medicine

## 2023-11-29 ENCOUNTER — Other Ambulatory Visit

## 2023-11-29 ENCOUNTER — Encounter: Payer: Self-pay | Admitting: Internal Medicine

## 2023-11-29 VITALS — BP 116/72 | HR 59 | Temp 98.3°F | Ht 64.0 in | Wt 166.0 lb

## 2023-11-29 DIAGNOSIS — E782 Mixed hyperlipidemia: Secondary | ICD-10-CM | POA: Diagnosis not present

## 2023-11-29 DIAGNOSIS — Z0000 Encounter for general adult medical examination without abnormal findings: Secondary | ICD-10-CM

## 2023-11-29 DIAGNOSIS — R7303 Prediabetes: Secondary | ICD-10-CM

## 2023-11-29 DIAGNOSIS — D649 Anemia, unspecified: Secondary | ICD-10-CM | POA: Diagnosis not present

## 2023-11-29 DIAGNOSIS — E559 Vitamin D deficiency, unspecified: Secondary | ICD-10-CM

## 2023-11-29 DIAGNOSIS — L814 Other melanin hyperpigmentation: Secondary | ICD-10-CM | POA: Diagnosis not present

## 2023-11-29 DIAGNOSIS — F419 Anxiety disorder, unspecified: Secondary | ICD-10-CM

## 2023-11-29 DIAGNOSIS — D485 Neoplasm of uncertain behavior of skin: Secondary | ICD-10-CM | POA: Diagnosis not present

## 2023-11-29 DIAGNOSIS — L821 Other seborrheic keratosis: Secondary | ICD-10-CM | POA: Diagnosis not present

## 2023-11-29 DIAGNOSIS — L578 Other skin changes due to chronic exposure to nonionizing radiation: Secondary | ICD-10-CM | POA: Diagnosis not present

## 2023-11-29 DIAGNOSIS — D229 Melanocytic nevi, unspecified: Secondary | ICD-10-CM | POA: Diagnosis not present

## 2023-11-29 DIAGNOSIS — K219 Gastro-esophageal reflux disease without esophagitis: Secondary | ICD-10-CM

## 2023-11-29 LAB — LIPID PANEL
Cholesterol: 177 mg/dL (ref 0–200)
HDL: 45.7 mg/dL (ref >39.00)
LDL Cholesterol: 113 mg/dL — ABNORMAL HIGH (ref 0–99)
NonHDL: 130.84
Total CHOL/HDL Ratio: 4
Triglycerides: 89 mg/dL (ref 0.0–149.0)
VLDL: 17.8 mg/dL (ref 0.0–40.0)

## 2023-11-29 LAB — CBC WITH DIFFERENTIAL/PLATELET
Basophils Absolute: 0 10*3/uL (ref 0.0–0.1)
Basophils Relative: 0.7 % (ref 0.0–3.0)
Eosinophils Absolute: 0.1 10*3/uL (ref 0.0–0.7)
Eosinophils Relative: 1.2 % (ref 0.0–5.0)
HCT: 37.9 % (ref 36.0–46.0)
Hemoglobin: 12.9 g/dL (ref 12.0–15.0)
Lymphocytes Relative: 24.8 % (ref 12.0–46.0)
Lymphs Abs: 1.7 10*3/uL (ref 0.7–4.0)
MCHC: 34 g/dL (ref 30.0–36.0)
MCV: 92.4 fl (ref 78.0–100.0)
Monocytes Absolute: 0.4 10*3/uL (ref 0.1–1.0)
Monocytes Relative: 6 % (ref 3.0–12.0)
Neutro Abs: 4.6 10*3/uL (ref 1.4–7.7)
Neutrophils Relative %: 67.3 % (ref 43.0–77.0)
Platelets: 255 10*3/uL (ref 150.0–400.0)
RBC: 4.1 Mil/uL (ref 3.87–5.11)
RDW: 13.3 % (ref 11.5–15.5)
WBC: 6.8 10*3/uL (ref 4.0–10.5)

## 2023-11-29 LAB — COMPREHENSIVE METABOLIC PANEL WITH GFR
ALT: 13 U/L (ref 0–35)
AST: 17 U/L (ref 0–37)
Albumin: 4.2 g/dL (ref 3.5–5.2)
Alkaline Phosphatase: 48 U/L (ref 39–117)
BUN: 11 mg/dL (ref 6–23)
CO2: 24 meq/L (ref 19–32)
Calcium: 8.9 mg/dL (ref 8.4–10.5)
Chloride: 105 meq/L (ref 96–112)
Creatinine, Ser: 0.51 mg/dL (ref 0.40–1.20)
GFR: 107.8 mL/min (ref >60.00)
Glucose, Bld: 90 mg/dL (ref 70–99)
Potassium: 4.1 meq/L (ref 3.5–5.1)
Sodium: 138 meq/L (ref 135–145)
Total Bilirubin: 0.5 mg/dL (ref 0.2–1.2)
Total Protein: 7.1 g/dL (ref 6.0–8.3)

## 2023-11-29 LAB — HEMOGLOBIN A1C: Hgb A1c MFr Bld: 5.4 % (ref 4.6–6.5)

## 2023-11-29 LAB — VITAMIN D 25 HYDROXY (VIT D DEFICIENCY, FRACTURES): VITD: 19.82 ng/mL — ABNORMAL LOW (ref 30.00–100.00)

## 2023-11-29 LAB — TSH: TSH: 2.11 u[IU]/mL (ref 0.35–5.50)

## 2023-11-29 NOTE — Assessment & Plan Note (Signed)
 Chronic Lab Results  Component Value Date   LDLCALC 113 (H) 11/29/2023   Continue exercise, work on weight loss

## 2023-11-29 NOTE — Assessment & Plan Note (Signed)
 Chronic Lab Results  Component Value Date   HGBA1C 5.4 11/29/2023   Sugars improved Low sugar / carb diet Stressed regular exercise

## 2023-11-29 NOTE — Assessment & Plan Note (Signed)
 Increased recently Perimenopause, work and his eldest son is ggetting ready for college and trying to pick a school Gyn prescribed paxil 10 mg daily - she has not started and will hold it for now She will start it if symptoms do not improve in next few weeks

## 2023-11-29 NOTE — Assessment & Plan Note (Signed)
 Chronic Not taking vitamin d  Start taking 1000-2000 units of vitamin D daily

## 2023-11-29 NOTE — Assessment & Plan Note (Addendum)
 Chronic Controlled Taking pepcid 20 mg daily - can try to taper off

## 2024-01-31 DIAGNOSIS — L719 Rosacea, unspecified: Secondary | ICD-10-CM | POA: Diagnosis not present

## 2024-04-25 DIAGNOSIS — Z1231 Encounter for screening mammogram for malignant neoplasm of breast: Secondary | ICD-10-CM | POA: Diagnosis not present

## 2024-04-25 LAB — HM MAMMOGRAPHY

## 2024-04-27 ENCOUNTER — Encounter: Payer: Self-pay | Admitting: Internal Medicine

## 2024-05-30 ENCOUNTER — Encounter: Payer: Self-pay | Admitting: Gastroenterology

## 2024-07-13 ENCOUNTER — Ambulatory Visit (AMBULATORY_SURGERY_CENTER): Admitting: *Deleted

## 2024-07-13 VITALS — Ht 65.0 in | Wt 165.0 lb

## 2024-07-13 DIAGNOSIS — Z800 Family history of malignant neoplasm of digestive organs: Secondary | ICD-10-CM

## 2024-07-13 DIAGNOSIS — L719 Rosacea, unspecified: Secondary | ICD-10-CM | POA: Insufficient documentation

## 2024-07-13 DIAGNOSIS — Z860100 Personal history of colon polyps, unspecified: Secondary | ICD-10-CM

## 2024-07-13 MED ORDER — NA SULFATE-K SULFATE-MG SULF 17.5-3.13-1.6 GM/177ML PO SOLN: 1.0000 | Freq: Once | ORAL | 0 refills | Status: AC

## 2024-07-13 NOTE — Progress Notes (Signed)
  Pre visit completed  over telephone. Instructions sent through MyChart   No egg or soy allergy known to patient  No issues known to pt with past sedation with any surgeries or procedures Patient denies ever being told they had issues or difficulty with intubation  No FH of Malignant Hyperthermia Pt is not on diet pills Pt is not on  home 02  Pt is not on blood thinners  Pt denies issues with constipation  No A fib or A flutter Have any cardiac testing pending-- no Pt instructed to use [email or web address] or GoodRx for a price reduction on prep    Chart previous reviewed by DOROTHA Schillings, CRNA

## 2024-07-27 ENCOUNTER — Encounter: Payer: Self-pay | Admitting: Gastroenterology

## 2024-08-03 ENCOUNTER — Ambulatory Visit: Admitting: Gastroenterology

## 2024-08-03 ENCOUNTER — Encounter: Payer: Self-pay | Admitting: Gastroenterology

## 2024-08-03 VITALS — BP 109/73 | HR 50 | Temp 97.5°F | Resp 12 | Ht 64.0 in | Wt 165.0 lb

## 2024-08-03 DIAGNOSIS — K6289 Other specified diseases of anus and rectum: Secondary | ICD-10-CM

## 2024-08-03 DIAGNOSIS — Z83719 Family history of colon polyps, unspecified: Secondary | ICD-10-CM | POA: Diagnosis not present

## 2024-08-03 DIAGNOSIS — Z860101 Personal history of adenomatous and serrated colon polyps: Secondary | ICD-10-CM | POA: Diagnosis not present

## 2024-08-03 DIAGNOSIS — Z800 Family history of malignant neoplasm of digestive organs: Secondary | ICD-10-CM | POA: Diagnosis not present

## 2024-08-03 DIAGNOSIS — Z1211 Encounter for screening for malignant neoplasm of colon: Secondary | ICD-10-CM | POA: Diagnosis present

## 2024-08-03 DIAGNOSIS — K640 First degree hemorrhoids: Secondary | ICD-10-CM | POA: Diagnosis not present

## 2024-08-03 DIAGNOSIS — Z860100 Personal history of colon polyps, unspecified: Secondary | ICD-10-CM

## 2024-08-03 MED ORDER — SODIUM CHLORIDE 0.9 % IV SOLN: 500.0000 mL | Freq: Once | INTRAVENOUS | Status: AC

## 2024-08-03 NOTE — Progress Notes (Unsigned)
 Pt's states no medical or surgical changes since previsit or office visit.

## 2024-08-03 NOTE — Progress Notes (Unsigned)
 GASTROENTEROLOGY PROCEDURE H&P NOTE   Primary Care Physician: Geofm Glade PARAS, MD  HPI: Andrea Wells is a 53 y.o. female who presents for colonoscopy for history of prior adenomas and high risk family history.  Past Medical History:  Diagnosis Date   Allergy    GERD (gastroesophageal reflux disease)    Palpitations    Postpartum depression    prev on sertraline for same   Past Surgical History:  Procedure Laterality Date   CESAREAN SECTION  02/2008 & 09/2005   x 2   COLONOSCOPY  last 02/28/2014   w/Dr.[email or web address]    Current Outpatient Medications  Medication Sig Dispense Refill   Collagen-Vitamin C-Biotin (COLLAGEN PO) Take by mouth. Marine collagen     famotidine  (PEPCID ) 20 MG tablet Take 1 tablet (20 mg total) by mouth at bedtime.     levonorgestrel  (MIRENA ) 20 MCG/24HR IUD 1 each by Intrauterine route once.     MAGNESIUM GLYCINATE PO Take 120 mg by mouth daily.     NON FORMULARY Take 1 capsule by mouth daily. Nutrafol     No current facility-administered medications for this visit.   Current Medications[1] Allergies[2] Family History  Problem Relation Age of Onset   Crohn's disease Mother        ? dx, improved w/ gluten free diet   Macular degeneration Mother    Prostate cancer Father 26   Colon cancer Father 88   Hypertension Father    Heart Problems Maternal Uncle        open heart valve repair, died year after   Diabetes Paternal Aunt    Breast cancer Paternal Aunt 79   Heart Problems Maternal Grandfather 50       complications of valve repair - died intraop   Breast cancer Paternal Grandmother    Diabetes Paternal Grandmother    Colon cancer Paternal Grandfather    Alcohol abuse Other    Colon polyps Neg Hx    Esophageal cancer Neg Hx    Stomach cancer Neg Hx    Rectal cancer Neg Hx    Social History   Socioeconomic History   Marital status: Married    Spouse name: Not on file   Number of children: 2   Years of education: Not on file   Highest  education level: Not on file  Occupational History   Not on file  Tobacco Use   Smoking status: Never   Smokeless tobacco: Never  Vaping Use   Vaping status: Never Used  Substance and Sexual Activity   Alcohol use: Yes    Alcohol/week: 4.0 standard drinks of alcohol    Types: 4 Glasses of wine per week    Comment: occasional   Drug use: No   Sexual activity: Not on file  Other Topics Concern   Not on file  Social History Narrative   Married, lives with spouse and 2 kids   Programmer, 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   Social Drivers of Health   Tobacco Use: Low Risk (11/28/2023)   Patient History    Smoking Tobacco Use: Never    Smokeless Tobacco Use: Never    Passive Exposure: Not on file  Financial Resource Strain: Not on file  Food Insecurity: Not on file  Transportation Needs: Not on file  Physical Activity: Not on file  Stress: Not on file  Social Connections: Not on file  Intimate Partner Violence: Not  on file  Depression (PHQ2-9): Medium Risk (10/25/2022)   Depression (PHQ2-9)    PHQ-2 Score: 6  Alcohol Screen: Not on file  Housing: Not on file  Utilities: Not on file  Health Literacy: Not on file    Physical Exam: There were no vitals filed for this visit. There is no height or weight on file to calculate BMI. GEN: NAD EYE: Sclerae anicteric ENT: MMM CV: Non-tachycardic GI: Soft, NT/ND NEURO:  Alert & Oriented x 3  Lab Results: No results for input(s): WBC, HGB, HCT, PLT in the last 72 hours. BMET No results for input(s): NA, K, CL, CO2, GLUCOSE, BUN, CREATININE, CALCIUM in the last 72 hours. LFT No results for input(s): PROT, ALBUMIN, AST, ALT, ALKPHOS, BILITOT, BILIDIR, IBILI in the last 72 hours. PT/INR No results for input(s): LABPROT, INR in the last 72 hours.   Impression / Plan: This is a 53 y.o.female who presents for colonoscopy for  history of prior adenomas and high risk family history.  The risks and benefits of endoscopic evaluation/treatment were discussed with the patient and/or family; these include but are not limited to the risk of perforation, infection, bleeding, missed lesions, lack of diagnosis, severe illness requiring hospitalization, as well as anesthesia and sedation related illnesses.  The patient's history has been reviewed, patient examined, no change in status, and deemed stable for procedure.  The patient and/or family was provided an opportunity to ask questions and all were answered.  The patient and/or family is agreeable to proceed.    Aloha Finner, MD [facility] Office # [phone]     [1]  Current Outpatient Medications:    Collagen-Vitamin C-Biotin (COLLAGEN PO), Take by mouth. Marine collagen, Disp: , Rfl:    famotidine  (PEPCID ) 20 MG tablet, Take 1 tablet (20 mg total) by mouth at bedtime., Disp: , Rfl:    levonorgestrel  (MIRENA ) 20 MCG/24HR IUD, 1 each by Intrauterine route once., Disp: , Rfl:    MAGNESIUM GLYCINATE PO, Take 120 mg by mouth daily., Disp: , Rfl:    NON FORMULARY, Take 1 capsule by mouth daily. Nutrafol, Disp: , Rfl:  [2]  Allergies Allergen Reactions   Penicillins Hives and Other (See Comments)   Sulfonamide Derivatives Hives and Other (See Comments)

## 2024-08-03 NOTE — Op Note (Signed)
 [facility] Patient Name: Andrea Wells Procedure Date: 08/03/2024 1:51 PM MRN: [id number] Endoscopist: Aloha Finner , MD, [id number] Age: 53 Referring MD:  Date of Birth: [birth year] Gender: Female Account #: [id number] Procedure:                Colonoscopy Indications:              Screening for colon cancer: Family history of colon                            polyps in distant relative(s) 60 or older,                            Screening in patient at increased risk: Colorectal                            cancer in father before age 60, Surveillance:                            Personal history of adenomatous polyps on last                            colonoscopy 5 years ago Medicines:                Monitored Anesthesia Care Procedure:                Pre-Anesthesia Assessment:                           - Prior to the procedure, a History and Physical                            was performed, and patient medications and                            allergies were reviewed. The patient's tolerance of                            previous anesthesia was also reviewed. The risks                            and benefits of the procedure and the sedation                            options and risks were discussed with the patient.                            All questions were answered, and informed consent                            was obtained. Prior Anticoagulants: The patient has                            taken no anticoagulant or antiplatelet agents. ASA  Grade Assessment: II - A patient with mild systemic                            disease. After reviewing the risks and benefits,                            the patient was deemed in satisfactory condition to                            undergo the procedure.                           After obtaining informed consent, the colonoscope                            was passed under direct vision.  Throughout the                            procedure, the patient's blood pressure, pulse, and                            oxygen saturations were monitored continuously. The                            Olympus Scope SN: [id number] was introduced through                            the anus and advanced to the the cecum, identified                            by the appendiceal orifice. The colonoscopy was                            performed without difficulty. The patient tolerated                            the procedure. The quality of the bowel preparation                            was good. The ileocecal valve, appendiceal orifice,                            and rectum were photographed. Scope In: 1:57:17 PM Scope Out: 2:07:30 PM Scope Withdrawal Time: 0 hours 7 minutes 45 seconds  Total Procedure Duration: 0 hours 10 minutes 13 seconds  Findings:                 The digital rectal exam was normal. Pertinent                            negatives include no palpable rectal lesions.                           Normal mucosa was found in the entire colon.  Anal papilla was hypertrophied.                           Non-bleeding non-thrombosed internal hemorrhoids                            were found during retroflexion, during perianal                            exam and during digital exam. The hemorrhoids were                            Grade I (internal hemorrhoids that do not prolapse). Complications:            No immediate complications. Estimated Blood Loss:     Estimated blood loss: none. Impression:               - Normal mucosa in the entire examined colon.                           - Anal papilla was hypertrophied.                           - Non-bleeding non-thrombosed internal hemorrhoids. Recommendation:           - The patient will be observed post-procedure,                            until all discharge criteria are met.                           -  Discharge patient to home.                           - Patient has a contact number available for                            emergencies. The signs and symptoms of potential                            delayed complications were discussed with the                            patient. Return to normal activities tomorrow.                            Written discharge instructions were provided to the                            patient.                           - High fiber diet.                           - Use FiberCon 1-2 tablets PO daily.                           -  Continue present medications.                           - Repeat colonoscopy in 5 years for surveillance                            due to FHx and prior history of adenomatous polyps.                           - The findings and recommendations were discussed                            with the patient.                           - The findings and recommendations were discussed                            with the patient's family. Aloha Finner, MD 08/03/2024 2:11:54 PM

## 2024-08-03 NOTE — Patient Instructions (Signed)
 High fiber diet.  Continue present medications.   Repeat colonoscopy in 5 years for surveillance.   YOU HAD AN ENDOSCOPIC PROCEDURE TODAY AT THE [facility]:   Refer to the procedure report that was given to you for any specific questions about what was found during the examination.  If the procedure report does not answer your questions, please call your gastroenterologist to clarify.  If you requested that your care partner not be given the details of your procedure findings, then the procedure report has been included in a sealed envelope for you to review at your convenience later.  YOU SHOULD EXPECT: Some feelings of bloating in the abdomen. Passage of more gas than usual.  Walking can help get rid of the air that was put into your GI tract during the procedure and reduce the bloating. If you had a lower endoscopy (such as a colonoscopy or flexible sigmoidoscopy) you may notice spotting of blood in your stool or on the toilet paper. If you underwent a bowel prep for your procedure, you may not have a normal bowel movement for a few days.  Please Note:  You might notice some irritation and congestion in your nose or some drainage.  This is from the oxygen used during your procedure.  There is no need for concern and it should clear up in a day or so.  SYMPTOMS TO REPORT IMMEDIATELY:  Following lower endoscopy (colonoscopy or flexible sigmoidoscopy):  Excessive amounts of blood in the stool  Significant tenderness or worsening of abdominal pains  Swelling of the abdomen that is new, acute  Fever of 100F or higher  For urgent or emergent issues, a gastroenterologist can be reached at any hour by calling [phone]. Do not use MyChart messaging for urgent concerns.    DIET:  We do recommend a small meal at first, but then you may proceed to your regular diet.  Drink plenty of fluids but you should avoid alcoholic beverages for 24 hours.  ACTIVITY:  You should plan to take  it easy for the rest of today and you should NOT DRIVE or use heavy machinery until tomorrow (because of the sedation medicines used during the test).    FOLLOW UP: Our staff will call the number listed on your records the next business day following your procedure.  We will call around 7:15- 8:00 am to check on you and address any questions or concerns that you may have regarding the information given to you following your procedure. If we do not reach you, we will leave a message.     If any biopsies were taken you will be contacted by phone or by letter within the next 1-3 weeks.  Please call us  at [phone] if you have not heard about the biopsies in 3 weeks.    SIGNATURES/CONFIDENTIALITY: You and/or your care partner have signed paperwork which will be entered into your electronic medical record.  These signatures attest to the fact that that the information above on your After Visit Summary has been reviewed and is understood.  Full responsibility of the confidentiality of this discharge information lies with you and/or your care-partner.

## 2024-08-03 NOTE — Progress Notes (Signed)
 Vss nad trans to pacu

## 2024-08-06 ENCOUNTER — Telehealth: Payer: Self-pay

## 2024-08-06 NOTE — Telephone Encounter (Signed)
 Left message on follow up call.
